# Patient Record
Sex: Male | Born: 1980 | Race: White | Hispanic: No | Marital: Married | Smoking: Former smoker
Health system: Southern US, Community
[De-identification: ages and names within clinical notes are randomized; demographics above are authoritative.]

## PROBLEM LIST (undated history)

## (undated) DIAGNOSIS — T7840XA Allergy, unspecified, initial encounter: Secondary | ICD-10-CM

## (undated) HISTORY — DX: Allergy, unspecified, initial encounter: T78.40XA

## (undated) HISTORY — PX: WRIST FRACTURE SURGERY: SHX121

## (undated) HISTORY — PX: NASAL SEPTUM SURGERY: SHX37

---

## 2003-12-01 ENCOUNTER — Emergency Department (HOSPITAL_COMMUNITY): Admission: EM | Admit: 2003-12-01 | Discharge: 2003-12-01 | Payer: Self-pay | Admitting: Emergency Medicine

## 2007-05-31 ENCOUNTER — Ambulatory Visit: Payer: Self-pay | Admitting: Internal Medicine

## 2007-05-31 DIAGNOSIS — J309 Allergic rhinitis, unspecified: Secondary | ICD-10-CM | POA: Insufficient documentation

## 2007-06-01 LAB — CONVERTED CEMR LAB
ALT: 30 units/L (ref 0–53)
Bilirubin, Direct: 0.3 mg/dL (ref 0.0–0.3)
CO2: 29 meq/L (ref 19–32)
Calcium: 9.4 mg/dL (ref 8.4–10.5)
Eosinophils Absolute: 0.2 10*3/uL (ref 0.0–0.6)
Eosinophils Relative: 2.6 % (ref 0.0–5.0)
GFR calc Af Amer: 131 mL/min
GFR calc non Af Amer: 108 mL/min
Glucose, Bld: 89 mg/dL (ref 70–99)
HDL: 29.3 mg/dL — ABNORMAL LOW (ref >39.0)
Hemoglobin: 15.3 g/dL (ref 13.0–17.0)
Lymphocytes Relative: 26.9 % (ref 12.0–46.0)
MCV: 89.5 fL (ref 78.0–100.0)
Monocytes Absolute: 0.7 10*3/uL (ref 0.2–0.7)
Neutro Abs: 4.6 10*3/uL (ref 1.4–7.7)
Platelets: 254 10*3/uL (ref 150–400)
Potassium: 4.2 meq/L (ref 3.5–5.1)
Sodium: 139 meq/L (ref 135–145)
TSH: 0.84 microintl units/mL (ref 0.35–5.50)
Total Protein: 6.8 g/dL (ref 6.0–8.3)
Triglycerides: 110 mg/dL (ref 0–149)
WBC: 7.5 10*3/uL (ref 4.5–10.5)

## 2008-01-11 ENCOUNTER — Ambulatory Visit: Payer: Self-pay | Admitting: Gastroenterology

## 2008-01-16 LAB — CONVERTED CEMR LAB
ALT: 22 units/L (ref 0–53)
Basophils Absolute: 0 10*3/uL (ref 0.0–0.1)
Basophils Relative: 0.3 % (ref 0.0–3.0)
CO2: 31 meq/L (ref 19–32)
Calcium: 8.9 mg/dL (ref 8.4–10.5)
Chloride: 105 meq/L (ref 96–112)
Creatinine, Ser: 0.8 mg/dL (ref 0.4–1.5)
GFR calc Af Amer: 149 mL/min
GFR calc non Af Amer: 123 mL/min
Glucose, Bld: 89 mg/dL (ref 70–99)
Hemoglobin: 15.3 g/dL (ref 13.0–17.0)
Lymphocytes Relative: 31.7 % (ref 12.0–46.0)
Monocytes Relative: 8.8 % (ref 3.0–12.0)
Neutro Abs: 3.6 10*3/uL (ref 1.4–7.7)
Neutrophils Relative %: 56.1 % (ref 43.0–77.0)
RBC: 4.86 M/uL (ref 4.22–5.81)
Total Bilirubin: 1.4 mg/dL — ABNORMAL HIGH (ref 0.3–1.2)
Total Protein: 6.7 g/dL (ref 6.0–8.3)
WBC: 6.5 10*3/uL (ref 4.5–10.5)

## 2008-01-29 ENCOUNTER — Encounter: Payer: Self-pay | Admitting: Internal Medicine

## 2008-01-29 ENCOUNTER — Ambulatory Visit: Payer: Self-pay | Admitting: Gastroenterology

## 2008-01-29 ENCOUNTER — Encounter: Payer: Self-pay | Admitting: Gastroenterology

## 2008-02-03 ENCOUNTER — Encounter: Payer: Self-pay | Admitting: Gastroenterology

## 2008-06-13 ENCOUNTER — Ambulatory Visit: Payer: Self-pay | Admitting: Internal Medicine

## 2008-06-13 LAB — CONVERTED CEMR LAB
ALT: 19 units/L (ref 0–53)
AST: 20 units/L (ref 0–37)
Albumin: 4.2 g/dL (ref 3.5–5.2)
Basophils Relative: 0 % (ref 0.0–3.0)
Bilirubin Urine: NEGATIVE
Blood in Urine, dipstick: NEGATIVE
Chloride: 106 meq/L (ref 96–112)
Eosinophils Relative: 4.9 % (ref 0.0–5.0)
GFR calc non Af Amer: 122.8 mL/min (ref >60)
Glucose, Urine, Semiquant: NEGATIVE
HCT: 45 % (ref 39.0–52.0)
Hemoglobin: 15.7 g/dL (ref 13.0–17.0)
Ketones, urine, test strip: NEGATIVE
LDL Cholesterol: 117 mg/dL — ABNORMAL HIGH (ref 0–99)
Lymphs Abs: 1.7 10*3/uL (ref 0.7–4.0)
MCV: 90.3 fL (ref 78.0–100.0)
Monocytes Relative: 8.2 % (ref 3.0–12.0)
Neutro Abs: 3.6 10*3/uL (ref 1.4–7.7)
Nitrite: NEGATIVE
Potassium: 4.4 meq/L (ref 3.5–5.1)
Protein, U semiquant: NEGATIVE
RBC: 4.98 M/uL (ref 4.22–5.81)
Sodium: 144 meq/L (ref 135–145)
Specific Gravity, Urine: 1.02
TSH: 1.04 microintl units/mL (ref 0.35–5.50)
Total Bilirubin: 1.7 mg/dL — ABNORMAL HIGH (ref 0.3–1.2)
Total Protein: 7.4 g/dL (ref 6.0–8.3)
Urobilinogen, UA: 0.2
VLDL: 6.4 mg/dL (ref 0.0–40.0)
WBC Urine, dipstick: NEGATIVE
WBC: 6.1 10*3/uL (ref 4.5–10.5)
pH: 5

## 2008-07-11 ENCOUNTER — Ambulatory Visit: Payer: Self-pay | Admitting: Internal Medicine

## 2008-11-21 ENCOUNTER — Ambulatory Visit: Payer: Self-pay | Admitting: Family Medicine

## 2008-12-18 ENCOUNTER — Ambulatory Visit (HOSPITAL_COMMUNITY): Payer: Self-pay | Admitting: Psychology

## 2009-01-01 ENCOUNTER — Ambulatory Visit (HOSPITAL_COMMUNITY): Payer: Self-pay | Admitting: Psychology

## 2009-01-08 ENCOUNTER — Ambulatory Visit (HOSPITAL_COMMUNITY): Payer: Self-pay | Admitting: Psychology

## 2009-01-15 ENCOUNTER — Ambulatory Visit (HOSPITAL_COMMUNITY): Payer: Self-pay | Admitting: Psychology

## 2009-01-29 ENCOUNTER — Ambulatory Visit (HOSPITAL_COMMUNITY): Payer: Self-pay | Admitting: Psychology

## 2009-02-06 ENCOUNTER — Ambulatory Visit: Payer: Self-pay | Admitting: Internal Medicine

## 2009-02-06 DIAGNOSIS — F17200 Nicotine dependence, unspecified, uncomplicated: Secondary | ICD-10-CM

## 2009-02-11 ENCOUNTER — Ambulatory Visit (HOSPITAL_COMMUNITY): Payer: Self-pay | Admitting: Psychiatry

## 2009-03-13 ENCOUNTER — Ambulatory Visit: Payer: Self-pay | Admitting: Internal Medicine

## 2009-03-18 ENCOUNTER — Ambulatory Visit (HOSPITAL_COMMUNITY): Payer: Self-pay | Admitting: Psychiatry

## 2009-04-20 ENCOUNTER — Ambulatory Visit (HOSPITAL_COMMUNITY): Payer: Self-pay | Admitting: Psychiatry

## 2009-05-21 ENCOUNTER — Ambulatory Visit: Payer: Self-pay | Admitting: Internal Medicine

## 2009-06-23 ENCOUNTER — Ambulatory Visit: Payer: Self-pay | Admitting: Internal Medicine

## 2009-07-01 ENCOUNTER — Ambulatory Visit (HOSPITAL_COMMUNITY): Payer: Self-pay | Admitting: Psychiatry

## 2009-08-12 ENCOUNTER — Ambulatory Visit: Payer: Self-pay | Admitting: Family Medicine

## 2009-08-21 ENCOUNTER — Ambulatory Visit: Payer: Self-pay | Admitting: Internal Medicine

## 2009-08-21 DIAGNOSIS — N453 Epididymo-orchitis: Secondary | ICD-10-CM | POA: Insufficient documentation

## 2009-09-10 ENCOUNTER — Ambulatory Visit: Payer: Self-pay | Admitting: Internal Medicine

## 2009-10-14 ENCOUNTER — Ambulatory Visit (HOSPITAL_COMMUNITY): Payer: Self-pay | Admitting: Psychiatry

## 2009-10-21 ENCOUNTER — Ambulatory Visit (HOSPITAL_COMMUNITY): Payer: Self-pay | Admitting: Marriage and Family Therapist

## 2009-10-27 ENCOUNTER — Telehealth: Payer: Self-pay | Admitting: Internal Medicine

## 2009-11-11 ENCOUNTER — Ambulatory Visit (HOSPITAL_COMMUNITY): Payer: Self-pay | Admitting: Psychiatry

## 2009-11-25 ENCOUNTER — Ambulatory Visit (HOSPITAL_COMMUNITY): Payer: Self-pay | Admitting: Marriage and Family Therapist

## 2009-12-02 ENCOUNTER — Ambulatory Visit (HOSPITAL_COMMUNITY): Payer: Self-pay | Admitting: Marriage and Family Therapist

## 2009-12-07 ENCOUNTER — Ambulatory Visit (HOSPITAL_COMMUNITY): Payer: Self-pay | Admitting: Psychiatry

## 2009-12-08 ENCOUNTER — Ambulatory Visit: Payer: Self-pay | Admitting: Internal Medicine

## 2009-12-23 ENCOUNTER — Ambulatory Visit (HOSPITAL_COMMUNITY): Payer: Self-pay | Admitting: Marriage and Family Therapist

## 2009-12-30 ENCOUNTER — Ambulatory Visit (HOSPITAL_COMMUNITY): Payer: Self-pay | Admitting: Marriage and Family Therapist

## 2010-01-04 ENCOUNTER — Ambulatory Visit (HOSPITAL_COMMUNITY): Payer: Self-pay | Admitting: Psychiatry

## 2010-02-15 ENCOUNTER — Ambulatory Visit (HOSPITAL_COMMUNITY): Payer: Self-pay | Admitting: Psychiatry

## 2010-03-15 ENCOUNTER — Ambulatory Visit (HOSPITAL_COMMUNITY): Payer: Self-pay | Admitting: Psychiatry

## 2010-03-15 ENCOUNTER — Inpatient Hospital Stay (HOSPITAL_COMMUNITY)
Admission: RE | Admit: 2010-03-15 | Discharge: 2010-03-18 | Payer: Self-pay | Source: Home / Self Care | Attending: Psychiatry | Admitting: Psychiatry

## 2010-03-23 ENCOUNTER — Other Ambulatory Visit (HOSPITAL_COMMUNITY)
Admission: RE | Admit: 2010-03-23 | Discharge: 2010-04-15 | Payer: Self-pay | Source: Home / Self Care | Attending: Psychiatry | Admitting: Psychiatry

## 2010-04-19 ENCOUNTER — Ambulatory Visit (HOSPITAL_COMMUNITY): Admit: 2010-04-19 | Payer: Self-pay | Admitting: Psychiatry

## 2010-04-21 ENCOUNTER — Ambulatory Visit (HOSPITAL_COMMUNITY)
Admission: RE | Admit: 2010-04-21 | Discharge: 2010-04-21 | Payer: Self-pay | Source: Home / Self Care | Attending: Marriage and Family Therapist | Admitting: Marriage and Family Therapist

## 2010-04-28 NOTE — Assessment & Plan Note (Signed)
Summary: ?bronchitis/njr   Vital Signs:  Patient profile:   30 year old male Weight:      224 pounds Temp:     98.7 degrees F oral BP sitting:   120 / 78  (right arm) Cuff size:   regular  Vitals Entered By: Duard Brady LPN (May 21, 2009 3:41 PM) CC: c/o congestion , productive cough - son and wife with bronchitis Is Patient Diabetic? No   Primary Care Provider:  Odette Horns MD  CC:  c/o congestion  and productive cough - son and wife with bronchitis.  History of Present Illness: 30 year old patient who presents with a 3-day history of head and chest congestion.  He has had minimal cough.  He does have a history of asthma, which has been stable this week.  No fever, wheezing, shortness of breath or chest pain.  Denies any chills.  His wife is been treated for bronchitis.  Preventive Screening-Counseling & Management  Alcohol-Tobacco     Smoking Status: current  Allergies (verified): No Known Drug Allergies  Past History:  Past Medical History: Reviewed history from 02/06/2009 and no changes required. Allergic rhinitis  never tested  Childhood asthma  Past Surgical History: Reviewed history from 02/06/2009 and no changes required. Fractured wrist, 2000 with pinning  Flex Sigmoidoscopy 11-09 hx of  turbinate reduction 2003  deviated septum   Review of Systems       The patient complains of prolonged cough.  The patient denies anorexia, fever, weight loss, weight gain, vision loss, decreased hearing, hoarseness, chest pain, syncope, dyspnea on exertion, peripheral edema, headaches, hemoptysis, abdominal pain, melena, hematochezia, severe indigestion/heartburn, hematuria, incontinence, genital sores, muscle weakness, suspicious skin lesions, transient blindness, difficulty walking, depression, unusual weight change, abnormal bleeding, enlarged lymph nodes, angioedema, breast masses, and testicular masses.    Physical Exam  General:   Well-developed,well-nourished,in no acute distress; alert,appropriate and cooperative throughout examination Head:  Normocephalic and atraumatic without obvious abnormalities. No apparent alopecia or balding. Eyes:  No corneal or conjunctival inflammation noted. EOMI. Perrla. Funduscopic exam benign, without hemorrhages, exudates or papilledema. Vision grossly normal. Ears:  External ear exam shows no significant lesions or deformities.  Otoscopic examination reveals clear canals, tympanic membranes are intact bilaterally without bulging, retraction, inflammation or discharge. Hearing is grossly normal bilaterally. Mouth:  Oral mucosa and oropharynx without lesions or exudates.  Teeth in good repair. Neck:  No deformities, masses, or tenderness noted. Lungs:  Normal respiratory effort, chest expands symmetrically. Lungs are clear to auscultation, no crackles or wheezes. Heart:  Normal rate and regular rhythm. S1 and S2 normal without gallop, murmur, click, rub or other extra sounds. Abdomen:  Bowel sounds positive,abdomen soft and non-tender without masses, organomegaly or hernias noted.   Impression & Recommendations:  Problem # 1:  URI (ICD-465.9)  His updated medication list for this problem includes:    Hydrocodone-homatropine 5-1.5 Mg/42ml Syrp (Hydrocodone-homatropine) ..... One tsp every 6 hours for cough  His updated medication list for this problem includes:    Hydrocodone-homatropine 5-1.5 Mg/33ml Syrp (Hydrocodone-homatropine) ..... One tsp every 6 hours for cough  Problem # 2:  ? of COUGH VARIANT ASTHMA (ICD-493.82)  The following medications were removed from the medication list:    Qvar 40 Mcg/act Aers (Beclomethasone dipropionate) .Marland Kitchen... 2 puffs two times a day His updated medication list for this problem includes:    Ventolin Hfa 108 (90 Base) Mcg/act Aers (Albuterol sulfate) .Marland Kitchen... 2 puffs q 6 hours as needed wheezing    Qvar  80 Mcg/act Aers (Beclomethasone dipropionate) .....  Use twice daily  The following medications were removed from the medication list:    Qvar 40 Mcg/act Aers (Beclomethasone dipropionate) .Marland Kitchen... 2 puffs two times a day His updated medication list for this problem includes:    Ventolin Hfa 108 (90 Base) Mcg/act Aers (Albuterol sulfate) .Marland Kitchen... 2 puffs q 6 hours as needed wheezing    Qvar 80 Mcg/act Aers (Beclomethasone dipropionate) ..... Use twice daily  Complete Medication List: 1)  Multi Vitamin Mens Tabs (Multiple vitamin) .... Take 1 tab daily 2)  Ventolin Hfa 108 (90 Base) Mcg/act Aers (Albuterol sulfate) .... 2 puffs q 6 hours as needed wheezing 3)  Lamictal 100 Mg Tabs (Lamotrigine) .Marland Kitchen.. 1 once daily 4)  Qvar 80 Mcg/act Aers (Beclomethasone dipropionate) .... Use twice daily 5)  Hydrocodone-homatropine 5-1.5 Mg/79ml Syrp (Hydrocodone-homatropine) .... One tsp every 6 hours for cough  Patient Instructions: 1)  Get plenty of rest, drink lots of clear liquids, and use Tylenol or Ibuprofen for fever and comfort. Return in 7-10 days if you're not better:sooner if you're feeling worse. Prescriptions: HYDROCODONE-HOMATROPINE 5-1.5 MG/5ML SYRP (HYDROCODONE-HOMATROPINE) one tsp every 6 hours for cough  #6 oz x 0   Entered and Authorized by:   Gordy Savers  MD   Signed by:   Gordy Savers  MD on 05/21/2009   Method used:   Print then Give to Patient   RxID:   [id number] QVAR 80 MCG/ACT AERS (BECLOMETHASONE DIPROPIONATE) use twice daily  #3 x 6   Entered and Authorized by:   Gordy Savers  MD   Signed by:   Gordy Savers  MD on 05/21/2009   Method used:   Print then Give to Patient   RxID:   [id number]

## 2010-04-28 NOTE — Assessment & Plan Note (Signed)
Summary: ?STREP/ACHEY/FEVER/CJR   Vital Signs:  Patient profile:   30 year old male Temp:     98.4 degrees F oral BP sitting:   110 / 72  (left arm) Cuff size:   large  Vitals Entered By: Sid Falcon LPN (Aug 12, 2009 2:48 PM) CC: ? strep throat, low grade fever   History of Present Illness: Acute visit for sore throat. Onset 4 days ago. Diffuse body aches. Low-grade fever and intermittent headache. Denies any nausea, vomiting, or diarrhea. No nasal congestive symptoms and no cough.  Allergies (verified): No Known Drug Allergies  Past History:  Past Medical History: Last updated: 02/06/2009 Allergic rhinitis  never tested  Childhood asthma  Review of Systems      See HPI  Physical Exam  General:  Well-developed,well-nourished,in no acute distress; alert,appropriate and cooperative throughout examination Ears:  External ear exam shows no significant lesions or deformities.  Otoscopic examination reveals clear canals, tympanic membranes are intact bilaterally without bulging, retraction, inflammation or discharge. Hearing is grossly normal bilaterally. Nose:  External nasal examination shows no deformity or inflammation. Nasal mucosa are pink and moist without lesions or exudates. Mouth:  2+ symmetrically enlarged tonsils which are erythematous without exudate Neck:  No deformities, masses, or tenderness noted. Lungs:  Normal respiratory effort, chest expands symmetrically. Lungs are clear to auscultation, no crackles or wheezes. Heart:  Normal rate and regular rhythm. S1 and S2 normal without gallop, murmur, click, rub or other extra sounds. Skin:  no rash   Impression & Recommendations:  Problem # 1:  ACUTE PHARYNGITIS (ICD-462)  rapid strep neg but clinical suspicion for possible strep with lack of nasal sxs, cough,et.  His updated medication list for this problem includes:    Amoxicillin 875 Mg Tabs (Amoxicillin) ..... One by mouth two times a day for 10  days  Orders: Rapid Strep ([address])  Complete Medication List: 1)  Multi Vitamin Mens Tabs (Multiple vitamin) .... Take 1 tab daily 2)  Ventolin Hfa 108 (90 Base) Mcg/act Aers (Albuterol sulfate) .... 2 puffs q 6 hours as needed wheezing 3)  Lamictal 100 Mg Tabs (Lamotrigine) .Marland Kitchen.. 1 once daily 4)  Qvar 80 Mcg/act Aers (Beclomethasone dipropionate) .... Use twice daily 5)  Hydrocodone-homatropine 5-1.5 Mg/2ml Syrp (Hydrocodone-homatropine) .... One tsp every 6 hours for cough 6)  Prednisone 10 Mg Tabs (Prednisone) .... One twice daily 7)  Amoxicillin 875 Mg Tabs (Amoxicillin) .... One by mouth two times a day for 10 days  Patient Instructions: 1)  Be in touch if symptoms not improving over the next several days Prescriptions: AMOXICILLIN 875 MG TABS (AMOXICILLIN) one by mouth two times a day for 10 days  #20 x 0   Entered and Authorized by:   Evelena Peat MD   Signed by:   Evelena Peat MD on 08/12/2009   Method used:   [facility]        [facility] [phone][facility] (retail)       [address]       [place], [place]  [address]       Ph: [phone]       Fax: [phone]   RxID:   [id number]   Laboratory Results    Other Tests  Rapid Strep: negative Comments: Rita Ohara  Aug 12, 2009 2:53 PM   Kit Test Internal QC: Negative   (Normal Range: Negative)

## 2010-04-28 NOTE — Assessment & Plan Note (Signed)
Summary: gi issues--ok per dr k//ccm   Vital Signs:  Patient profile:   30 year old male Weight:      222 pounds Temp:     98.4 degrees F oral BP sitting:   100 / 60  (right arm) Cuff size:   regular  Vitals Entered By: Duard Brady LPN (September 10, 2009 9:30 AM) CC: f/u on last visit  Is Patient Diabetic? No   Primary Care Provider:  Odette Horns MD  CC:  f/u on last visit .  History of Present Illness: 30 year old patient who was treated 3 weeks ago for epididymitis.  since that time.  All symptoms have resolved including testicular pain, fever, and chills.  He feels his left testicle is somewhat smaller than the right and this is what prompted his visit.  He was concerned about permanent testicular damage or impaired function related to the infection.  Preventive Screening-Counseling & Management  Alcohol-Tobacco     Smoking Status: current  Allergies (verified): No Known Drug Allergies And he is to and a Physical Exam  General:  Well-developed,well-nourished,in no acute distress; alert,appropriate and cooperative throughout examination Genitalia:  the left testicle was slightly smaller compared to the right, but no other abnormalities were appreciated   Impression & Recommendations:  Problem # 1:  EPIDIDYMITIS (ICD-604.90) patient's epididymitis has resolved, and his clinical exam is felt to be within normal limits.  Will clinically observe at this time.  Will consider a scrotal ultrasound or urological referral.  If there is any change  Complete Medication List: 1)  Multi Vitamin Mens Tabs (Multiple vitamin) .... Take 1 tab daily 2)  Ventolin Hfa 108 (90 Base) Mcg/act Aers (Albuterol sulfate) .... 2 puffs q 6 hours as needed wheezing 3)  Lamictal 100 Mg Tabs (Lamotrigine) .Marland Kitchen.. 1 once daily 4)  Qvar 80 Mcg/act Aers (Beclomethasone dipropionate) .... Use twice daily 5)  Prednisone 10 Mg Tabs (Prednisone) .... One twice daily 6)  Amoxicillin 875 Mg Tabs  (Amoxicillin) .... One by mouth two times a day for 10 days 7)  Levaquin 500 Mg Tabs (Levofloxacin) .... One tablet daily  Patient Instructions: 1)  Please schedule a follow-up appointment as needed.

## 2010-04-28 NOTE — Progress Notes (Signed)
Summary: nasonex rx  Phone Note Call from Patient   Caller: Patient Call For: Gordy Savers  MD Summary of Call: has been seen in the past for sinus issues, was given sample of nasonex - worked well - would like rx for med called to [facility] if possible.   can be reach at [phone] Initial call taken by: Duard Brady LPN,  October 27, 2009 1:53 PM  Follow-up for Phone Call        ok  RF 3 Follow-up by: Gordy Savers  MD,  October 27, 2009 1:56 PM  Additional Follow-up for Phone Call Additional follow up Details #1::        attempt to call - ans mach - LMTCB if questions - [facility] . change to med list  done.   KIK Additional Follow-up by: Duard Brady LPN,  October 27, 2009 2:18 PM    New/Updated Medications: NASONEX 50 MCG/ACT SUSP (MOMETASONE FUROATE) 2 sprays each nostril once daily Prescriptions: NASONEX 50 MCG/ACT SUSP (MOMETASONE FUROATE) 2 sprays each nostril once daily  #1 x 3   Entered by:   Duard Brady LPN   Authorized by:   Gordy Savers  MD   Signed by:   Duard Brady LPN on 09/81/1914   Method used:   [facility] to        [facility] [phone]* (retail)       [address]       [place], [place]  [address]       Ph: [phone]       Fax: [phone]   RxID:   [id number]

## 2010-04-28 NOTE — Assessment & Plan Note (Signed)
Summary: lower abdominal pain/cjr   Vital Signs:  Patient profile:   30 year old male Weight:      222 pounds Temp:     100.2 degrees F oral BP sitting:   110 / 74  (right arm) Cuff size:   regular  Vitals Entered By: Duard Brady LPN (Aug 21, 2009 11:14 AM) CC: c/o fever 102-103 , swelling in both testicals since last night only Is Patient Diabetic? No   Primary Care Provider:  Odette Horns MD  CC:  c/o fever 102-103  and swelling in both testicals since last night only.  History of Present Illness: 30 year old patient who is complaining amoxicillin for acute sinusitis.  Yesterday, at approximately  5 p.m. he developed bilateral testicular discomfort; through the night.  He has had fever and some associated chills.  He presents today with a chief complaint of testicular pain.  The left greater than right.  He is married with a young child at home and has had a monogamous relationship.  No history of STDs  Allergies (verified): No Known Drug Allergies  Past History:  Past Medical History: Reviewed history from 02/06/2009 and no changes required. Allergic rhinitis  never tested  Childhood asthma  Physical Exam  General:  Well-developed,well-nourished,in no acute distress; alert,appropriate and cooperative throughout examination Head:  Normocephalic and atraumatic without obvious abnormalities. No apparent alopecia or balding. Eyes:  No corneal or conjunctival inflammation noted. EOMI. Perrla. Funduscopic exam benign, without hemorrhages, exudates or papilledema. Vision grossly normal. Ears:  External ear exam shows no significant lesions or deformities.  Otoscopic examination reveals clear canals, tympanic membranes are intact bilaterally without bulging, retraction, inflammation or discharge. Hearing is grossly normal bilaterally. Mouth:  pharyngeal erythema.  pharyngeal erythema.   Neck:  No deformities, masses, or tenderness noted. Lungs:  Normal respiratory  effort, chest expands symmetrically. Lungs are clear to auscultation, no crackles or wheezes. Heart:  Normal rate and regular rhythm. S1 and S2 normal without gallop, murmur, click, rub or other extra sounds. Abdomen:  Bowel sounds positive,abdomen soft and non-tender without masses, organomegaly or hernias noted. Genitalia:  bilateral testicular tenderness, but no exquisite pain or swelling noted no inguinal  adenopathy   Impression & Recommendations:  Problem # 1:  EPIDIDYMITIS (ICD-604.90)  Problem # 2:  ACUTE PHARYNGITIS (ICD-462)  His updated medication list for this problem includes:    Amoxicillin 875 Mg Tabs (Amoxicillin) ..... One by mouth two times a day for 10 days    Levaquin 500 Mg Tabs (Levofloxacin) ..... One tablet daily  His updated medication list for this problem includes:    Amoxicillin 875 Mg Tabs (Amoxicillin) ..... One by mouth two times a day for 10 days    Levaquin 500 Mg Tabs (Levofloxacin) ..... One tablet daily  Complete Medication List: 1)  Multi Vitamin Mens Tabs (Multiple vitamin) .... Take 1 tab daily 2)  Ventolin Hfa 108 (90 Base) Mcg/act Aers (Albuterol sulfate) .... 2 puffs q 6 hours as needed wheezing 3)  Lamictal 100 Mg Tabs (Lamotrigine) .Marland Kitchen.. 1 once daily 4)  Qvar 80 Mcg/act Aers (Beclomethasone dipropionate) .... Use twice daily 5)  Prednisone 10 Mg Tabs (Prednisone) .... One twice daily 6)  Amoxicillin 875 Mg Tabs (Amoxicillin) .... One by mouth two times a day for 10 days 7)  Levaquin 500 Mg Tabs (Levofloxacin) .... One tablet daily  Patient Instructions: 1)  Take your antibiotic as prescribed until ALL of it is gone, but stop if you develop a  rash or swelling and contact our office as soon as possible. 2)  call if pain or fever worsens Prescriptions: LEVAQUIN 500 MG TABS (LEVOFLOXACIN) one tablet daily  #7 x 0   Entered and Authorized by:   Gordy Savers  MD   Signed by:   Gordy Savers  MD on 08/21/2009   Method used:   Print  then Give to Patient   RxID:   [id number]

## 2010-04-28 NOTE — Assessment & Plan Note (Signed)
Summary: tonsil issues//cm   Vital Signs:  Patient profile:   30 year old male Weight:      224 pounds Temp:     99.4 degrees F oral BP sitting:   126 / 80  (left arm) Cuff size:   regular  Vitals Entered By: Duard Brady LPN (December 08, 2009 10:16 AM) CC: c/o sore throat  and congestions x1wk Is Patient Diabetic? No   Primary Care Provider:  Odette Horns MD  CC:  c/o sore throat  and congestions x1wk.  History of Present Illness: 30 year old patient who has a history of chronic recurrent tonsillitis.  He has had episodes for recurrent strep pharyngitis as well.  For the past week.  He has had increasing sore throat that has worsened.  over the years.  He has had recurrent tonsillitis that has resulted in considerable loss of work.  He is noted.  Low grade fever.  He has a history of asthma, which has been stable.  He is asking about ENT referral and possible tonsillectomy  Preventive Screening-Counseling & Management  Alcohol-Tobacco     Smoking Cessation Counseling: yes  Allergies (verified): No Known Drug Allergies  Past History:  Past Medical History: Allergic rhinitis  never tested  Childhood asthma recurrent pustular tonsillitis  Past Surgical History: Reviewed history from 02/06/2009 and no changes required. Fractured wrist, 2000 with pinning  Flex Sigmoidoscopy 11-09 hx of  turbinate reduction 2003  deviated septum   Review of Systems       The patient complains of fever.  The patient denies anorexia, weight loss, weight gain, vision loss, decreased hearing, hoarseness, chest pain, syncope, dyspnea on exertion, peripheral edema, prolonged cough, headaches, hemoptysis, abdominal pain, melena, hematochezia, severe indigestion/heartburn, hematuria, incontinence, genital sores, muscle weakness, suspicious skin lesions, transient blindness, difficulty walking, depression, unusual weight change, abnormal bleeding, enlarged lymph nodes, angioedema, breast  masses, and testicular masses.    Physical Exam  General:  Well-developed,well-nourished,in no acute distress; alert,appropriate and cooperative throughout examination Head:  Normocephalic and atraumatic without obvious abnormalities. No apparent alopecia or balding. Eyes:  No corneal or conjunctival inflammation noted. EOMI. Perrla. Funduscopic exam benign, without hemorrhages, exudates or papilledema. Vision grossly normal. Ears:  External ear exam shows no significant lesions or deformities.  Otoscopic examination reveals clear canals, tympanic membranes are intact bilaterally without bulging, retraction, inflammation or discharge. Hearing is grossly normal bilaterally. Nose:  External nasal examination shows no deformity or inflammation. Nasal mucosa are pink and moist without lesions or exudates. Mouth:  markedly enlarged tonsils with pustules Neck:  No deformities, masses, or tenderness noted. Lungs:  Normal respiratory effort, chest expands symmetrically. Lungs are clear to auscultation, no crackles or wheezes. Heart:  Normal rate and regular rhythm. S1 and S2 normal without gallop, murmur, click, rub or other extra sounds.   Impression & Recommendations:  Problem # 1:  ACUTE PHARYNGITIS (ICD-462)  The following medications were removed from the medication list:    Amoxicillin 875 Mg Tabs (Amoxicillin) ..... One by mouth two times a day for 10 days    Levaquin 500 Mg Tabs (Levofloxacin) ..... One tablet daily His updated medication list for this problem includes:    Amoxicillin-pot Clavulanate 875-125 Mg Tabs (Amoxicillin-pot clavulanate) ..... One twice daily  Orders: Rapid Strep ([address]109) ENT Referral (ENT)  The following medications were removed from the medication list:    Amoxicillin 875 Mg Tabs (Amoxicillin) ..... One by mouth two times a day for 10 days    Levaquin 500  Mg Tabs (Levofloxacin) ..... One tablet daily His updated medication list for this problem includes:     Amoxicillin-pot Clavulanate 875-125 Mg Tabs (Amoxicillin-pot clavulanate) ..... One twice daily  Problem # 2:  ? of COUGH VARIANT ASTHMA (ICD-493.82)  The following medications were removed from the medication list:    Prednisone 10 Mg Tabs (Prednisone) ..... One twice daily His updated medication list for this problem includes:    Ventolin Hfa 108 (90 Base) Mcg/act Aers (Albuterol sulfate) .Marland Kitchen... 2 puffs q 6 hours as needed wheezing    Qvar 80 Mcg/act Aers (Beclomethasone dipropionate) ..... Use twice daily  The following medications were removed from the medication list:    Prednisone 10 Mg Tabs (Prednisone) ..... One twice daily His updated medication list for this problem includes:    Ventolin Hfa 108 (90 Base) Mcg/act Aers (Albuterol sulfate) .Marland Kitchen... 2 puffs q 6 hours as needed wheezing    Qvar 80 Mcg/act Aers (Beclomethasone dipropionate) ..... Use twice daily  Complete Medication List: 1)  Multi Vitamin Mens Tabs (Multiple vitamin) .... Take 1 tab daily 2)  Ventolin Hfa 108 (90 Base) Mcg/act Aers (Albuterol sulfate) .... 2 puffs q 6 hours as needed wheezing 3)  Lamictal 100 Mg Tabs (Lamotrigine) .Marland Kitchen.. 1 once daily 4)  Qvar 80 Mcg/act Aers (Beclomethasone dipropionate) .... Use twice daily 5)  Nasonex 50 Mcg/act Susp (Mometasone furoate) .... 2 sprays each nostril once daily 6)  Abilify 10 Mg Tabs (Aripiprazole) .... Qd 7)  Amoxicillin-pot Clavulanate 875-125 Mg Tabs (Amoxicillin-pot clavulanate) .... One twice daily  Patient Instructions: 1)  ENT consult, as scheduled 2)  Tobacco is very bad for your health and your loved ones! You Should stop smoking!. 3)  Please schedule a follow-up appointment as needed. 4)  Take your antibiotic as prescribed until ALL of it is gone, but stop if you develop a rash or swelling and contact our office as soon as possible. Prescriptions: AMOXICILLIN-POT CLAVULANATE 875-125 MG TABS (AMOXICILLIN-POT CLAVULANATE) one twice daily  #20 x 0   Entered and  Authorized by:   Gordy Savers  MD   Signed by:   Gordy Savers  MD on 12/08/2009   Method used:   [facility] to        [facility] [phone]* (retail)       [address]       [place], [place]  [address]       Ph: [phone]       Fax: [phone]   RxID:   [id number]   Laboratory Results  Date/Time Received: December 08, 2009 10:33 AM  Date/Time Reported: December 08, 2009 10:33 AM   Other Tests  Rapid Strep: negative

## 2010-04-28 NOTE — Assessment & Plan Note (Signed)
Summary: BODY ACHES, FEVER, LETHARGIC // RS   Vital Signs:  Patient profile:   30 year old male Weight:      224 pounds Temp:     97.6 degrees F oral BP sitting:   120 / 80  (right arm) Cuff size:   regular  Vitals Entered By: Duard Brady LPN (June 23, 2009 3:17 PM) CC: c/o bodyaches and fatigue , cold sweats  since yesterday Is Patient Diabetic? No   Primary Care Provider:  Odette Horns MD  CC:  c/o bodyaches and fatigue  and cold sweats  since yesterday.  History of Present Illness: 30 year old  patient, who has a history of asthma.  He was last seen here approximately 6 weeks ago with you are on and has had some low-grade persistent cough.  He is on maintenance inhalational steroids, as well as albuterol.  The past couple days.  She has had myalgias, weakness sweats, and increasing cough, and chest congestion.  He has felt unwell has been no documented fever but he has been taking Tylenol.  No active wheezing or shortness of breath.  He does have a history of ongoing tobacco use  Preventive Screening-Counseling & Management  Alcohol-Tobacco     Smoking Status: current     Smoking Cessation Counseling: yes  Allergies (verified): No Known Drug Allergies  Past History:  Past Medical History: Reviewed history from 02/06/2009 and no changes required. Allergic rhinitis  never tested  Childhood asthma  Review of Systems       The patient complains of anorexia, fever, and prolonged cough.  The patient denies weight loss, weight gain, vision loss, decreased hearing, hoarseness, chest pain, syncope, dyspnea on exertion, peripheral edema, headaches, hemoptysis, abdominal pain, melena, hematochezia, severe indigestion/heartburn, hematuria, incontinence, genital sores, muscle weakness, suspicious skin lesions, transient blindness, difficulty walking, depression, unusual weight change, abnormal bleeding, enlarged lymph nodes, angioedema, breast masses, and testicular  masses.    Physical Exam  General:  Well-developed,well-nourished,in no acute distress; alert,appropriate and cooperative throughout examination Head:  Normocephalic and atraumatic without obvious abnormalities. No apparent alopecia or balding. Eyes:  No corneal or conjunctival inflammation noted. EOMI. Perrla. Funduscopic exam benign, without hemorrhages, exudates or papilledema. Vision grossly normal. Ears:  External ear exam shows no significant lesions or deformities.  Otoscopic examination reveals clear canals, tympanic membranes are intact bilaterally without bulging, retraction, inflammation or discharge. Hearing is grossly normal bilaterally. Mouth:  Oral mucosa and oropharynx without lesions or exudates.  Teeth in good repair. Neck:  No deformities, masses, or tenderness noted. Lungs:  few coarse rhonchi faint wheezing on the right normal expiratory effort Heart:  Normal rate and regular rhythm. S1 and S2 normal without gallop, murmur, click, rub or other extra sounds. Abdomen:  Bowel sounds positive,abdomen soft and non-tender without masses, organomegaly or hernias noted.   Impression & Recommendations:  Problem # 1:  URI (ICD-465.9)  His updated medication list for this problem includes:    Hydrocodone-homatropine 5-1.5 Mg/19ml Syrp (Hydrocodone-homatropine) ..... One tsp every 6 hours for cough  His updated medication list for this problem includes:    Hydrocodone-homatropine 5-1.5 Mg/40ml Syrp (Hydrocodone-homatropine) ..... One tsp every 6 hours for cough  Problem # 2:  ? of COUGH VARIANT ASTHMA (ICD-493.82)  His updated medication list for this problem includes:    Ventolin Hfa 108 (90 Base) Mcg/act Aers (Albuterol sulfate) .Marland Kitchen... 2 puffs q 6 hours as needed wheezing    Qvar 80 Mcg/act Aers (Beclomethasone dipropionate) ..... Use twice daily  Prednisone 10 Mg Tabs (Prednisone) ..... One twice daily  His updated medication list for this problem includes:    Ventolin Hfa  108 (90 Base) Mcg/act Aers (Albuterol sulfate) .Marland Kitchen... 2 puffs q 6 hours as needed wheezing    Qvar 80 Mcg/act Aers (Beclomethasone dipropionate) ..... Use twice daily    Prednisone 10 Mg Tabs (Prednisone) ..... One twice daily  Problem # 3:  TOBACCO USE (ICD-305.1)  Complete Medication List: 1)  Multi Vitamin Mens Tabs (Multiple vitamin) .... Take 1 tab daily 2)  Ventolin Hfa 108 (90 Base) Mcg/act Aers (Albuterol sulfate) .... 2 puffs q 6 hours as needed wheezing 3)  Lamictal 100 Mg Tabs (Lamotrigine) .Marland Kitchen.. 1 once daily 4)  Qvar 80 Mcg/act Aers (Beclomethasone dipropionate) .... Use twice daily 5)  Hydrocodone-homatropine 5-1.5 Mg/46ml Syrp (Hydrocodone-homatropine) .... One tsp every 6 hours for cough 6)  Prednisone 10 Mg Tabs (Prednisone) .... One twice daily  Patient Instructions: 1)  Get plenty of rest, drink lots of clear liquids, and use Tylenol or Ibuprofen for fever and comfort. Return in 7-10 days if you're not better:sooner if you're feeling worse. 2)  Tobacco is very bad for your health and your loved ones! You Should stop smoking!. Prescriptions: HYDROCODONE-HOMATROPINE 5-1.5 MG/5ML SYRP (HYDROCODONE-HOMATROPINE) one tsp every 6 hours for cough  #6 oz x 0   Entered and Authorized by:   Gordy Savers  MD   Signed by:   Gordy Savers  MD on 06/23/2009   Method used:   Print then Give to Patient   RxID:   [id number] PREDNISONE 10 MG TABS (PREDNISONE) one twice daily  #14 x 0   Entered and Authorized by:   Gordy Savers  MD   Signed by:   Gordy Savers  MD on 06/23/2009   Method used:   Print then Give to Patient   RxID:   [id number]

## 2010-05-03 ENCOUNTER — Encounter (HOSPITAL_COMMUNITY): Payer: Managed Care, Other (non HMO) | Admitting: Psychiatry

## 2010-05-03 DIAGNOSIS — F39 Unspecified mood [affective] disorder: Secondary | ICD-10-CM

## 2010-05-04 ENCOUNTER — Encounter (HOSPITAL_COMMUNITY): Payer: Self-pay | Admitting: Marriage and Family Therapist

## 2010-05-06 ENCOUNTER — Inpatient Hospital Stay (HOSPITAL_COMMUNITY)
Admission: RE | Admit: 2010-05-06 | Discharge: 2010-05-09 | DRG: 882 | Disposition: A | Discharge status: Home / Self Care | Payer: Managed Care, Other (non HMO) | Source: Ambulatory Visit | Attending: Psychiatry | Admitting: Psychiatry

## 2010-05-06 DIAGNOSIS — F4310 Post-traumatic stress disorder, unspecified: Principal | ICD-10-CM

## 2010-05-06 DIAGNOSIS — J329 Chronic sinusitis, unspecified: Secondary | ICD-10-CM

## 2010-05-06 DIAGNOSIS — Z5689 Other problems related to employment: Secondary | ICD-10-CM

## 2010-05-06 DIAGNOSIS — Z9182 Personal history of military deployment: Secondary | ICD-10-CM

## 2010-05-06 DIAGNOSIS — J45909 Unspecified asthma, uncomplicated: Secondary | ICD-10-CM

## 2010-05-06 DIAGNOSIS — Z818 Family history of other mental and behavioral disorders: Secondary | ICD-10-CM

## 2010-05-07 DIAGNOSIS — F4310 Post-traumatic stress disorder, unspecified: Secondary | ICD-10-CM
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
## 2010-05-12 ENCOUNTER — Other Ambulatory Visit (HOSPITAL_COMMUNITY): Payer: Managed Care, Other (non HMO) | Attending: Psychiatry | Admitting: Psychiatry

## 2010-05-12 DIAGNOSIS — J329 Chronic sinusitis, unspecified: Secondary | ICD-10-CM | POA: Insufficient documentation

## 2010-05-12 DIAGNOSIS — F4310 Post-traumatic stress disorder, unspecified: Secondary | ICD-10-CM | POA: Insufficient documentation

## 2010-05-12 DIAGNOSIS — J45909 Unspecified asthma, uncomplicated: Secondary | ICD-10-CM | POA: Insufficient documentation

## 2010-05-13 ENCOUNTER — Other Ambulatory Visit (HOSPITAL_BASED_OUTPATIENT_CLINIC_OR_DEPARTMENT_OTHER): Payer: Managed Care, Other (non HMO) | Admitting: Psychiatry

## 2010-05-13 DIAGNOSIS — F4310 Post-traumatic stress disorder, unspecified: Secondary | ICD-10-CM

## 2010-05-14 ENCOUNTER — Other Ambulatory Visit (HOSPITAL_COMMUNITY): Payer: Managed Care, Other (non HMO) | Admitting: Psychiatry

## 2010-05-17 ENCOUNTER — Other Ambulatory Visit (HOSPITAL_COMMUNITY): Payer: Managed Care, Other (non HMO) | Admitting: Psychiatry

## 2010-05-17 ENCOUNTER — Encounter (HOSPITAL_COMMUNITY): Payer: Managed Care, Other (non HMO) | Admitting: Marriage and Family Therapist

## 2010-05-17 DIAGNOSIS — F39 Unspecified mood [affective] disorder: Secondary | ICD-10-CM

## 2010-05-17 DIAGNOSIS — F4310 Post-traumatic stress disorder, unspecified: Secondary | ICD-10-CM

## 2010-05-18 ENCOUNTER — Other Ambulatory Visit (HOSPITAL_COMMUNITY): Payer: Managed Care, Other (non HMO) | Admitting: Psychiatry

## 2010-05-19 ENCOUNTER — Other Ambulatory Visit (HOSPITAL_COMMUNITY): Payer: Managed Care, Other (non HMO) | Admitting: Psychiatry
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
## 2010-05-20 ENCOUNTER — Other Ambulatory Visit (HOSPITAL_COMMUNITY): Payer: Managed Care, Other (non HMO) | Admitting: Psychiatry

## 2010-05-21 ENCOUNTER — Other Ambulatory Visit (HOSPITAL_COMMUNITY): Payer: Managed Care, Other (non HMO) | Admitting: Psychiatry

## 2010-05-24 ENCOUNTER — Other Ambulatory Visit (HOSPITAL_COMMUNITY): Payer: Managed Care, Other (non HMO) | Admitting: Psychiatry

## 2010-05-25 ENCOUNTER — Other Ambulatory Visit (HOSPITAL_COMMUNITY): Payer: Managed Care, Other (non HMO) | Admitting: Psychiatry

## 2010-05-26 ENCOUNTER — Other Ambulatory Visit (HOSPITAL_COMMUNITY): Payer: Managed Care, Other (non HMO) | Admitting: Psychiatry

## 2010-05-27 ENCOUNTER — Other Ambulatory Visit (HOSPITAL_COMMUNITY): Payer: Managed Care, Other (non HMO) | Attending: Psychiatry | Admitting: Psychiatry

## 2010-05-27 DIAGNOSIS — J329 Chronic sinusitis, unspecified: Secondary | ICD-10-CM | POA: Insufficient documentation

## 2010-05-27 DIAGNOSIS — F4310 Post-traumatic stress disorder, unspecified: Secondary | ICD-10-CM | POA: Insufficient documentation

## 2010-05-27 DIAGNOSIS — J45909 Unspecified asthma, uncomplicated: Secondary | ICD-10-CM | POA: Insufficient documentation

## 2010-05-28 ENCOUNTER — Other Ambulatory Visit (HOSPITAL_COMMUNITY): Payer: Managed Care, Other (non HMO) | Admitting: Psychiatry

## 2010-05-31 ENCOUNTER — Other Ambulatory Visit (HOSPITAL_COMMUNITY): Payer: Managed Care, Other (non HMO) | Admitting: Psychiatry

## 2010-05-31 ENCOUNTER — Encounter (HOSPITAL_COMMUNITY): Payer: Managed Care, Other (non HMO) | Admitting: Psychiatry

## 2010-06-01 ENCOUNTER — Other Ambulatory Visit (HOSPITAL_COMMUNITY): Payer: Managed Care, Other (non HMO) | Admitting: Psychiatry

## 2010-06-02 ENCOUNTER — Other Ambulatory Visit (HOSPITAL_COMMUNITY): Payer: Managed Care, Other (non HMO) | Attending: Psychiatry | Admitting: Psychiatry

## 2010-06-02 DIAGNOSIS — J45909 Unspecified asthma, uncomplicated: Secondary | ICD-10-CM | POA: Insufficient documentation

## 2010-06-02 DIAGNOSIS — J329 Chronic sinusitis, unspecified: Secondary | ICD-10-CM | POA: Insufficient documentation

## 2010-06-02 DIAGNOSIS — F4310 Post-traumatic stress disorder, unspecified: Secondary | ICD-10-CM | POA: Insufficient documentation

## 2010-06-03 ENCOUNTER — Other Ambulatory Visit (HOSPITAL_COMMUNITY): Payer: Managed Care, Other (non HMO) | Admitting: Psychiatry

## 2010-06-04 ENCOUNTER — Other Ambulatory Visit (HOSPITAL_COMMUNITY): Payer: Managed Care, Other (non HMO) | Admitting: Psychiatry

## 2010-06-07 ENCOUNTER — Encounter (HOSPITAL_COMMUNITY): Payer: Managed Care, Other (non HMO) | Admitting: Psychiatry

## 2010-06-07 DIAGNOSIS — F39 Unspecified mood [affective] disorder: Secondary | ICD-10-CM

## 2010-06-07 LAB — CBC
MCH: 30.4 pg (ref 26.0–34.0)
MCHC: 34.2 g/dL (ref 30.0–36.0)
MCV: 88.9 fL (ref 78.0–100.0)
Platelets: 239 10*3/uL (ref 150–400)
RBC: 5.3 MIL/uL (ref 4.22–5.81)

## 2010-06-07 LAB — COMPREHENSIVE METABOLIC PANEL
Albumin: 4.3 g/dL (ref 3.5–5.2)
Alkaline Phosphatase: 53 U/L (ref 39–117)
BUN: 11 mg/dL (ref 6–23)
Chloride: 102 mEq/L (ref 96–112)
Creatinine, Ser: 0.98 mg/dL (ref 0.4–1.5)
Glucose, Bld: 100 mg/dL — ABNORMAL HIGH (ref 70–99)
Potassium: 4 mEq/L (ref 3.5–5.1)
Total Bilirubin: 1.4 mg/dL — ABNORMAL HIGH (ref 0.3–1.2)
Total Protein: 7.4 g/dL (ref 6.0–8.3)

## 2010-06-09 ENCOUNTER — Encounter (HOSPITAL_COMMUNITY): Payer: Managed Care, Other (non HMO) | Admitting: Psychiatry

## 2010-06-09 DIAGNOSIS — F3189 Other bipolar disorder: Secondary | ICD-10-CM

## 2010-06-10 ENCOUNTER — Encounter (HOSPITAL_COMMUNITY): Payer: Managed Care, Other (non HMO) | Admitting: Marriage and Family Therapist

## 2010-06-15 ENCOUNTER — Encounter (HOSPITAL_COMMUNITY): Payer: Managed Care, Other (non HMO) | Admitting: Marriage and Family Therapist

## 2010-06-30 ENCOUNTER — Encounter (HOSPITAL_COMMUNITY): Payer: Managed Care, Other (non HMO) | Admitting: Psychiatry

## 2010-06-30 DIAGNOSIS — F3189 Other bipolar disorder: Secondary | ICD-10-CM

## 2010-08-11 ENCOUNTER — Other Ambulatory Visit: Payer: Self-pay | Admitting: Internal Medicine

## 2010-08-25 ENCOUNTER — Encounter (HOSPITAL_COMMUNITY): Payer: Managed Care, Other (non HMO) | Admitting: Psychiatry

## 2010-09-01 ENCOUNTER — Encounter (HOSPITAL_COMMUNITY): Payer: Managed Care, Other (non HMO) | Admitting: Psychiatry

## 2010-09-01 DIAGNOSIS — F3189 Other bipolar disorder: Secondary | ICD-10-CM

## 2010-10-06 ENCOUNTER — Encounter (HOSPITAL_COMMUNITY): Payer: Managed Care, Other (non HMO) | Admitting: Psychiatry

## 2010-10-06 DIAGNOSIS — F3189 Other bipolar disorder: Secondary | ICD-10-CM

## 2010-10-08 NOTE — Progress Notes (Signed)
Scott Downs, Scott Downs              ACCOUNT NO.:  [id number]  MEDICAL RECORD NO.:  [id number]  LOCATION:  [facility]                           FACILITY:  BH  PHYSICIAN:  Syed T. Arfeen, M.D.   DATE OF BIRTH:  [birth year]                                PROGRESS NOTE   The patient came in today for his follow-up appointment.  He was last seen on September 01, 2010.  We started him on lithium and increased his Risperdal.  However, the patient is still using Risperdal 1 mg and stopped the lithium as he does not feel any change in his episodic anger.  The patient now decided to come off from his medication.  He feels that none of the medicine had helped him and he wants to try without medication to get his anger under control.  When I asked him how, he told that he has done okay without medication using relaxing technique and using the skills which he learned in individual counseling and psych IOP program.  The patient denies any suicidal thinking.  He reported he had never been suicidal in his life.  He continued to have episodic anger and paranoia, but reported none of the medication has helped him very much.  He had stopped taking prazosin a few weeks ago, and now he has decided to stop Risperdal and Zoloft.  I talked with him in length about possible consequences with the noncompliance with the treatment and medication.  However, the patient is determined that if he feels that he needs help or seen his episodic anger and paranoia more intense and frequent then he will call us back.  MENTAL STATUS EXAM: The patient is pleasant, cooperative.  His speech is clear and coherent. His thought process was logical, linear and goal-directed.  At this time, he does not have any auditory hallucinations, suicidal thoughts or homicidal thoughts, though he has some paranoia that people are talking about him which he has been for a long time, but overall, he is pleasant and engageable in the conversation.  There  were no delusions or psychosis present at this time.  He is alert and oriented x3.  His affect is pleasant.  His insight and judgment is fair, but he has a better and good impulse control.  DIAGNOSES: AXIS I:  Bipolar disorder, post-traumatic stress disorder.  PLAN: As mentioned above, I talked to the patient in length about the risk of noncompliance with the medication.  He agreed to cut down his Zoloft and Risperdal in half doses to see if he noticed any change in his behavior. I have recommended to take Risperdal half-tablet for at least 3 weeks and then cut down the Zoloft 1/2 tablet for another 3 weeks to see the response.  If at any time he starts noticing increased anger, agitation, depression, paranoia or anytime having suicidal thinking and homicidal thinking, then he needs to call 9-1-1 immediately or call us back or go to local ER.  We spent a good amount of time in safety plan.  The patient agreed that in case of crisis or any help, he will resume therapy treatment and make an appointment with [place].  He is  to continue to work at [employer] [place].  He likes his job in which he does not have to interfere and see a lot of people.  He is planning to move out from his home so that his wife can live in house.  He is actively looking for an apartment.  His friends are helping him to find a roommate.  Couple has decided to work together and not live part so that they continue to see their son on a regular basis.  I will give one more script of Risperdal and Zoloft with the above direction and recommended to call us back if he has any question or if he needs any help.  At this time, the patient does not want any follow-up appointment.  However, safety plan and crisis numbers are given.     Syed T. Lolly Mustache, M.D.     STA/MEDQ  D:  10/06/2010  T:  10/07/2010  Job:  [id number]  Electronically Signed by Kathryne Sharper M.D. on 10/08/2010 09:45:29 AM

## 2010-10-25 ENCOUNTER — Other Ambulatory Visit: Payer: Self-pay | Admitting: Internal Medicine

## 2010-12-29 ENCOUNTER — Encounter (HOSPITAL_COMMUNITY): Payer: Managed Care, Other (non HMO) | Admitting: Psychiatry

## 2010-12-29 DIAGNOSIS — F3189 Other bipolar disorder: Secondary | ICD-10-CM

## 2011-01-24 ENCOUNTER — Encounter (HOSPITAL_COMMUNITY): Payer: Managed Care, Other (non HMO) | Admitting: Psychiatry

## 2011-02-07 ENCOUNTER — Ambulatory Visit (INDEPENDENT_AMBULATORY_CARE_PROVIDER_SITE_OTHER): Payer: Managed Care, Other (non HMO) | Admitting: Internal Medicine

## 2011-02-07 DIAGNOSIS — Z23 Encounter for immunization: Secondary | ICD-10-CM

## 2011-02-07 DIAGNOSIS — Z0000 Encounter for general adult medical examination without abnormal findings: Secondary | ICD-10-CM

## 2011-05-02 IMAGING — CR DG CHEST 2V
2 series · 2 of 2 positions shown · non-contrast
Comparison: None

CLINICAL DATA: 2-month history of nocturnal coughing.  History of
smoking.

CHEST - 2 VIEW

[view not recorded (1 of 2)]
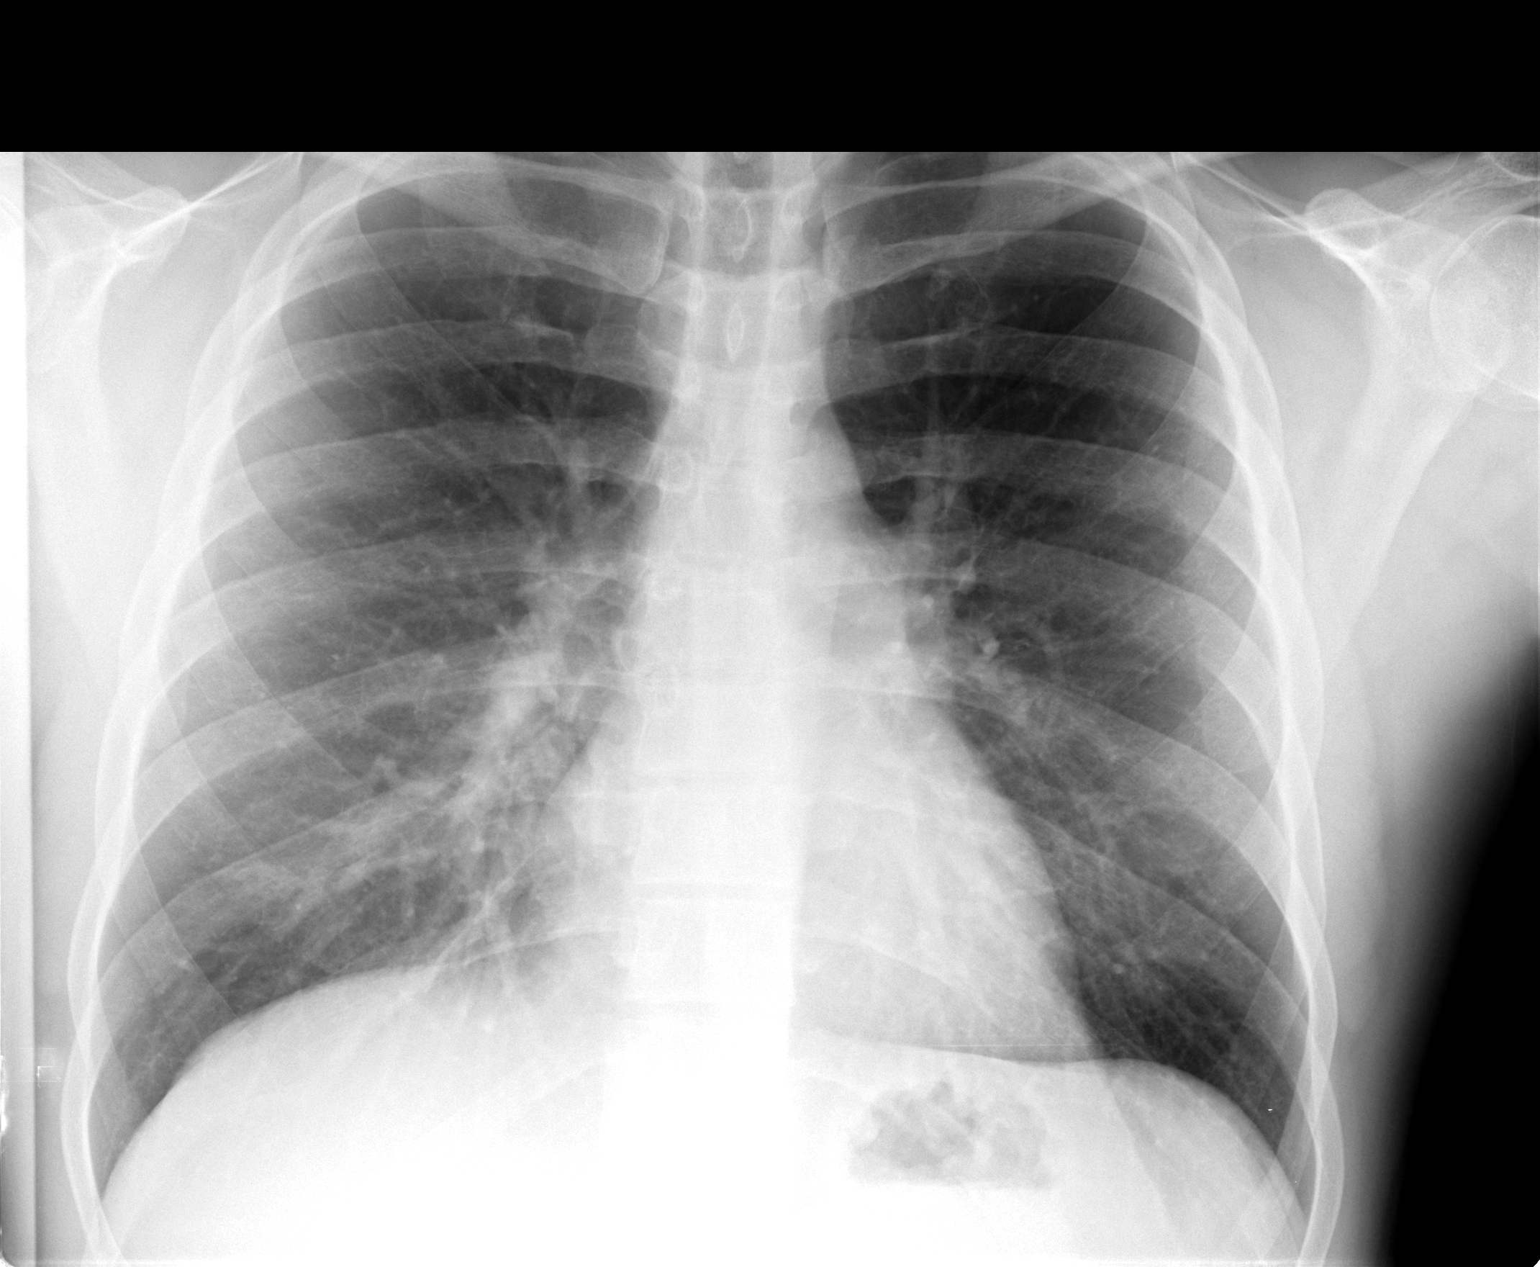

[view not recorded (2 of 2)]
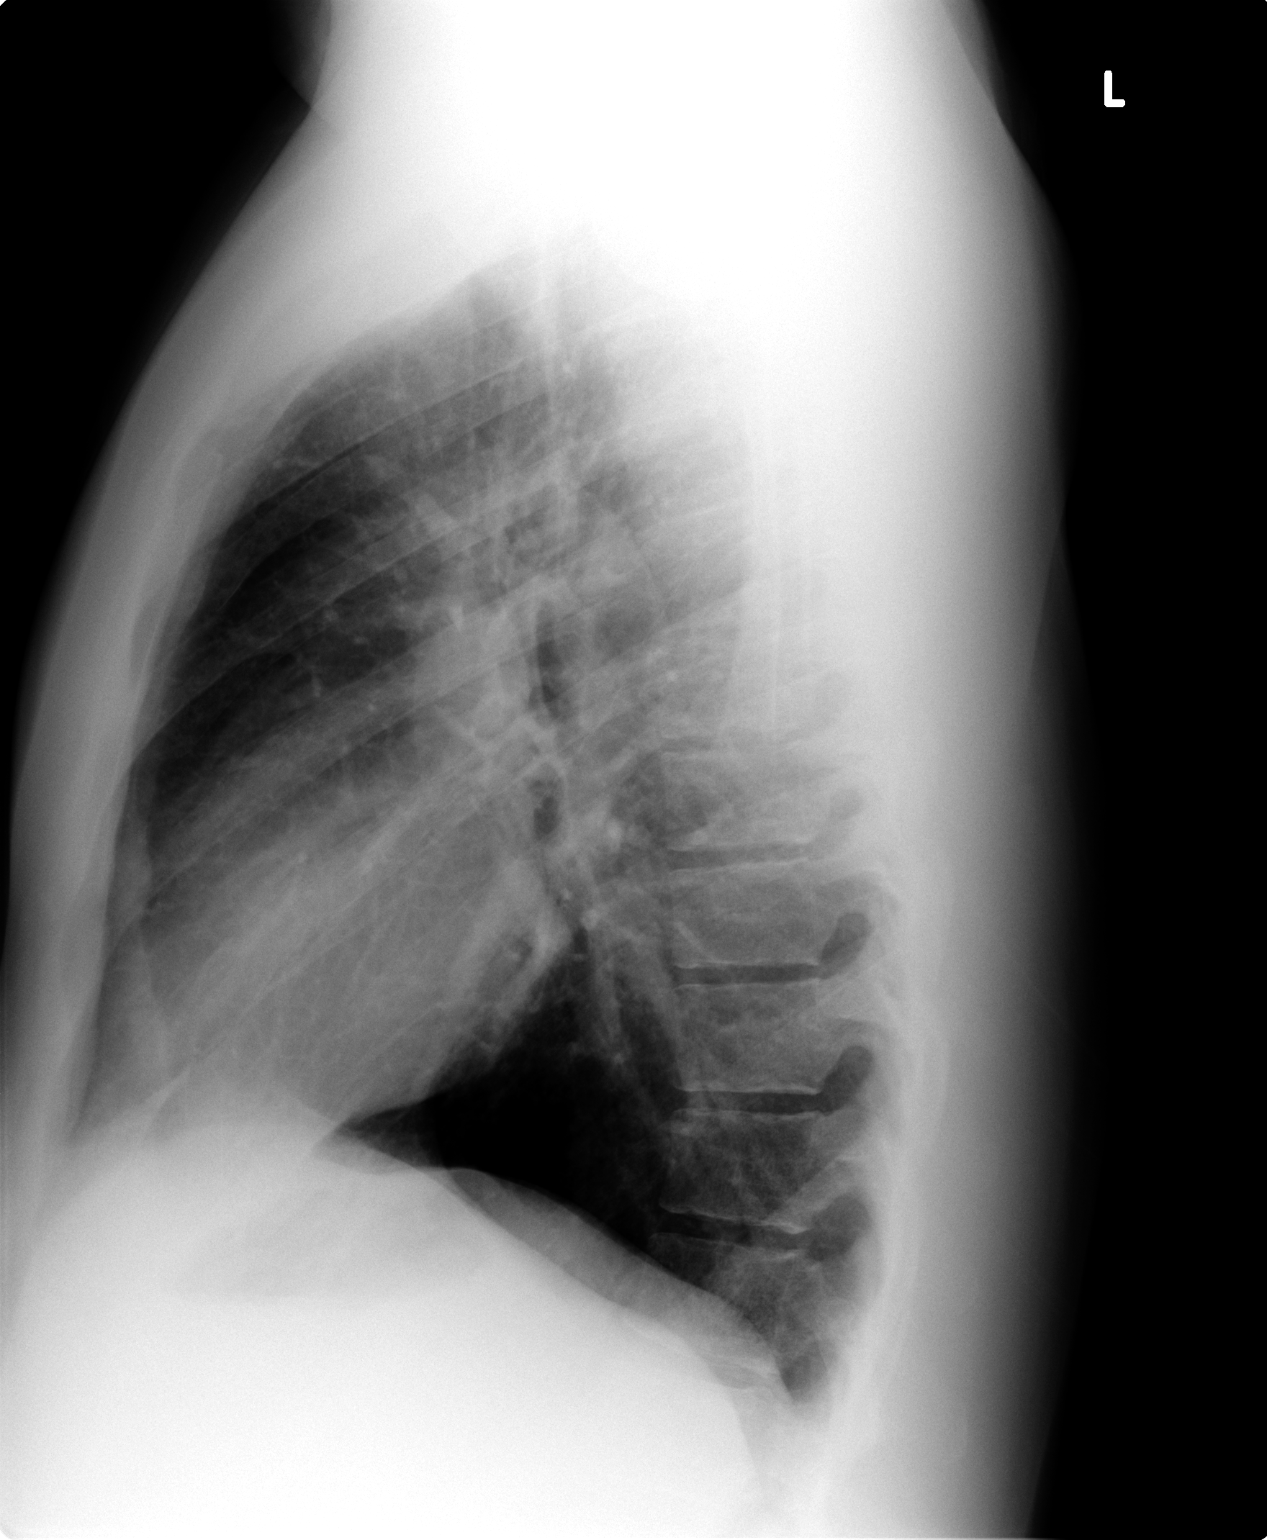

[2 of 2 positions shown; findings below may reference images not displayed]

FINDINGS: Two views of the chest were obtained.  There are a few
prominent lung markings along the right cardiac border without
focal airspace disease.  Densities along the right cardiac border
could represent atelectasis.  No evidence for pleural effusions.
Cardiomediastinal silhouette is within normal limits.  Bony
structures are intact.
IMPRESSION: No acute chest findings. Possible atelectasis along the medial
right lung base.

## 2011-06-07 ENCOUNTER — Encounter: Payer: Self-pay | Admitting: Internal Medicine

## 2011-06-07 ENCOUNTER — Ambulatory Visit (INDEPENDENT_AMBULATORY_CARE_PROVIDER_SITE_OTHER): Payer: Managed Care, Other (non HMO) | Admitting: Internal Medicine

## 2011-06-07 DIAGNOSIS — J45909 Unspecified asthma, uncomplicated: Secondary | ICD-10-CM

## 2011-06-07 DIAGNOSIS — K219 Gastro-esophageal reflux disease without esophagitis: Secondary | ICD-10-CM

## 2011-06-07 MED ORDER — BUDESONIDE-FORMOTEROL FUMARATE 160-4.5 MCG/ACT IN AERO: 2.0000 | INHALATION_SPRAY | Freq: Two times a day (BID) | RESPIRATORY_TRACT | Status: DC

## 2011-06-07 NOTE — Progress Notes (Signed)
  Subjective:    Patient ID: Scott Downs, male    DOB: June 15, 1980, 30 y.o.   MRN: [id number]  HPI 31 year old patient has a history of asthma. For the past 2 or 3 months she has had increasing wheezing and increasing the albuterol use. Prior to this period of time he has required little if any rescue albuterol. He has discontinued tobacco use at the first of the year. He's also had more reflux symptoms. He is being followed closely by psychiatry    Review of Systems  Constitutional: Negative for fever, chills, appetite change and fatigue.  HENT: Negative for hearing loss, ear pain, congestion, sore throat, trouble swallowing, neck stiffness, dental problem, voice change and tinnitus.   Eyes: Negative for pain, discharge and visual disturbance.  Respiratory: Positive for cough and wheezing. Negative for chest tightness and stridor.   Cardiovascular: Negative for chest pain, palpitations and leg swelling.  Gastrointestinal: Positive for abdominal pain. Negative for nausea, vomiting, diarrhea, constipation, blood in stool and abdominal distention.  Genitourinary: Negative for urgency, hematuria, flank pain, discharge, difficulty urinating and genital sores.  Musculoskeletal: Negative for myalgias, back pain, joint swelling, arthralgias and gait problem.  Skin: Negative for rash.  Neurological: Negative for dizziness, syncope, speech difficulty, weakness, numbness and headaches.  Hematological: Negative for adenopathy. Does not bruise/bleed easily.  Psychiatric/Behavioral: Negative for behavioral problems and dysphoric mood. The patient is not nervous/anxious.        Objective:   Physical Exam  Constitutional: He is oriented to person, place, and time. He appears well-developed.  HENT:  Head: Normocephalic.  Right Ear: External ear normal.  Left Ear: External ear normal.  Eyes: Conjunctivae and EOM are normal.  Neck: Normal range of motion.  Cardiovascular: Normal rate and normal  heart sounds.   Pulmonary/Chest: Effort normal and breath sounds normal. No respiratory distress. He has no wheezes. He has no rales.  Abdominal: Bowel sounds are normal.  Musculoskeletal: Normal range of motion. He exhibits no edema and no tenderness.  Neurological: He is alert and oriented to person, place, and time.  Psychiatric: He has a normal mood and affect. His behavior is normal.          Assessment & Plan:   Asthma. We'll treat with an aggressive anti-reflux regimen and discontinue Qvar. We'll substitute Symbicort and recheck in 1 month Gastroesophageal reflux disease. Antireflux measures discussed we'll treat with short term PPI therapy

## 2011-06-07 NOTE — Patient Instructions (Signed)
Avoids foods high in acid such as tomatoes citrus juices, and spicy foods.  Avoid eating within two hours of lying down or before exercising.  Do not overheat.  Try smaller more frequent meals.  If symptoms persist, elevate the head of her bed 12 inches while sleeping.  Gastroesophageal Reflux Disease, Adult Gastroesophageal reflux disease (GERD) happens when acid from your stomach flows up into the esophagus. When acid comes in contact with the esophagus, the acid causes soreness (inflammation) in the esophagus. Over time, GERD may create small holes (ulcers) in the lining of the esophagus. CAUSES    Increased body weight. This puts pressure on the stomach, making acid rise from the stomach into the esophagus.   Smoking. This increases acid production in the stomach.   Drinking alcohol. This causes decreased pressure in the lower esophageal sphincter (valve or ring of muscle between the esophagus and stomach), allowing acid from the stomach into the esophagus.   Late evening meals and a full stomach. This increases pressure and acid production in the stomach.   A malformed lower esophageal sphincter.  Sometimes, no cause is found. SYMPTOMS    Burning pain in the lower part of the mid-chest behind the breastbone and in the mid-stomach area. This may occur twice a week or more often.   Trouble swallowing.   Sore throat.   Dry cough.   Asthma-like symptoms including chest tightness, shortness of breath, or wheezing.  DIAGNOSIS   Your caregiver may be able to diagnose GERD based on your symptoms. In some cases, X-rays and other tests may be done to check for complications or to check the condition of your stomach and esophagus. TREATMENT   Your caregiver may recommend over-the-counter or prescription medicines to help decrease acid production. Ask your caregiver before starting or adding any new medicines.   HOME CARE INSTRUCTIONS    Change the factors that you can control. Ask your  caregiver for guidance concerning weight loss, quitting smoking, and alcohol consumption.   Avoid foods and drinks that make your symptoms worse, such as:   Caffeine or alcoholic drinks.   Chocolate.   Peppermint or mint flavorings.   Garlic and onions.   Spicy foods.   Citrus fruits, such as oranges, lemons, or limes.   Tomato-based foods such as sauce, chili, salsa, and pizza.   Fried and fatty foods.   Avoid lying down for the 3 hours prior to your bedtime or prior to taking a nap.   Eat small, frequent meals instead of large meals.   Wear loose-fitting clothing. Do not wear anything tight around your waist that causes pressure on your stomach.   Raise the head of your bed 6 to 8 inches with wood blocks to help you sleep. Extra pillows will not help.   Only take over-the-counter or prescription medicines for pain, discomfort, or fever as directed by your caregiver.   Do not take aspirin, ibuprofen, or other nonsteroidal anti-inflammatory drugs (NSAIDs).  SEEK IMMEDIATE MEDICAL CARE IF:    You have pain in your arms, neck, jaw, teeth, or back.   Your pain increases or changes in intensity or duration.   You develop nausea, vomiting, or sweating (diaphoresis).   You develop shortness of breath, or you faint.   Your vomit is green, yellow, black, or looks like coffee grounds or blood.   Your stool is red, bloody, or black.  These symptoms could be signs of other problems, such as heart disease, gastric bleeding, or esophageal  bleeding. MAKE SURE YOU:    Understand these instructions.   Will watch your condition.   Will get help right away if you are not doing well or get worse.  Document Released: 12/22/2004 Document Revised: 03/03/2011 Document Reviewed: 10/01/2010 [facility] Patient Information 2012 [place], [place].

## 2011-06-12 ENCOUNTER — Other Ambulatory Visit: Payer: Self-pay | Admitting: Internal Medicine

## 2011-10-28 ENCOUNTER — Telehealth: Payer: Self-pay | Admitting: Internal Medicine

## 2011-10-28 MED ORDER — DOXYCYCLINE HYCLATE 100 MG PO TABS: 100.0000 mg | ORAL_TABLET | Freq: Two times a day (BID) | ORAL | Status: AC

## 2011-10-28 NOTE — Telephone Encounter (Signed)
Doxycycline 100 mg #20 one twice a day Mucinex D twice daily  Use saline irrigation, warm  moist compresses and over-the-counter decongestants only as directed.  Call if there is no improvement in 5 to 7 days, or sooner if you develop increasing pain, fever, or any new symptoms.

## 2011-10-28 NOTE — Telephone Encounter (Signed)
Pt has sinus infection with cough and yellow nasal discharge.[facility]. Pt has history of sinus infections

## 2011-10-28 NOTE — Telephone Encounter (Signed)
Sinus headache , drainage , slight productive cough - wanted appt - none avaible today - please advise work in or rx to call out?

## 2011-10-28 NOTE — Telephone Encounter (Signed)
Spoke with pt - informed of dr. Vernon Prey instructions and med to be sent to [facility]

## 2011-11-17 ENCOUNTER — Other Ambulatory Visit: Payer: Self-pay | Admitting: Internal Medicine

## 2011-12-29 ENCOUNTER — Ambulatory Visit (INDEPENDENT_AMBULATORY_CARE_PROVIDER_SITE_OTHER): Payer: Managed Care, Other (non HMO) | Admitting: Internal Medicine

## 2011-12-29 DIAGNOSIS — Z23 Encounter for immunization: Secondary | ICD-10-CM

## 2012-02-02 ENCOUNTER — Other Ambulatory Visit (HOSPITAL_COMMUNITY): Payer: Self-pay | Admitting: Psychiatry

## 2012-02-02 ENCOUNTER — Telehealth (HOSPITAL_COMMUNITY): Payer: Self-pay

## 2012-02-02 NOTE — Telephone Encounter (Signed)
Call returned.  Patient was seen in this office in 2012.  Patient endorse increased depression anxiety and wants to be admitted.  Patient is service-connected at [facility] hospital.  Patient like to go [facility] however he is wondering if he can admit at [facility].  I suggested to try a [facility] since he is service-connected there.  Patient denies any suicidal thinking her homicidal thinking.

## 2012-02-22 ENCOUNTER — Encounter: Payer: Self-pay | Admitting: Internal Medicine

## 2012-02-22 ENCOUNTER — Ambulatory Visit (INDEPENDENT_AMBULATORY_CARE_PROVIDER_SITE_OTHER): Payer: Managed Care, Other (non HMO) | Admitting: Internal Medicine

## 2012-02-22 VITALS — BP 160/86 | HR 76 | Temp 98.4°F | Resp 18 | Wt 234.0 lb

## 2012-02-22 DIAGNOSIS — B351 Tinea unguium: Secondary | ICD-10-CM

## 2012-02-22 MED ORDER — TERBINAFINE HCL 250 MG PO TABS: 250.0000 mg | ORAL_TABLET | Freq: Every day | ORAL | Status: DC

## 2012-02-22 NOTE — Progress Notes (Signed)
Subjective:    Patient ID: Scott Downs, male    DOB: 03-07-1981, 31 y.o.   MRN: [id number]  HPI  31 year old patient who is seen today complaining of a toenail fungus. He has tried a number of topical medications for toenails that have become discolored and thickened.  Past Medical History  Diagnosis Date  . Allergy   . Asthma     History   Social History  . Marital Status: Married    Spouse Name: N/A    Number of Children: N/A  . Years of Education: N/A   Occupational History  . Not on file.   Social History Main Topics  . Smoking status: Former Smoker    Quit date: 03/29/2011  . Smokeless tobacco: Never Used  . Alcohol Use: Yes     Comment: rarely  . Drug Use: No  . Sexually Active: Not on file   Other Topics Concern  . Not on file   Social History Narrative  . No narrative on file    Past Surgical History  Procedure Date  . Nasal septum surgery   . Wrist fracture surgery     Family History  Problem Relation Age of Onset  . Cancer Mother     ovarian ca  . Heart disease Father     No Known Allergies  Current Outpatient Prescriptions on File Prior to Visit  Medication Sig Dispense Refill  . Multiple Vitamin (MULTIVITAMIN) tablet Take 1 tablet by mouth daily.      Marland Kitchen NASONEX 50 MCG/ACT nasal spray USE 2 SPRAYS IN EACH NOSTRIL ONCE DAILY  17 g  3  . prazosin (MINIPRESS) 1 MG capsule Take 1 mg by mouth at bedtime.      . risperiDONE (RISPERDAL) 1 MG tablet Take 1 mg by mouth daily.      . SYMBICORT 160-4.5 MCG/ACT inhaler INHALE 2 PUFFS BY MOUTH TWICE A DAY  10.2 g  1  . VENTOLIN HFA 108 (90 BASE) MCG/ACT inhaler USE 2 PUFFS EVERY 6 HOURS AS NEEDED FOR WHEEZING  2 Inhaler  1    BP 160/86  Pulse 76  Temp 98.4 F (36.9 C) (Oral)  Resp 18  Wt 234 lb (106.142 kg)       Review of Systems  Constitutional: Negative for fever, chills, appetite change and fatigue.  HENT: Negative for hearing loss, ear pain, congestion, sore throat, trouble  swallowing, neck stiffness, dental problem, voice change and tinnitus.   Eyes: Negative for pain, discharge and visual disturbance.  Respiratory: Negative for cough, chest tightness, wheezing and stridor.   Cardiovascular: Negative for chest pain, palpitations and leg swelling.  Gastrointestinal: Negative for nausea, vomiting, abdominal pain, diarrhea, constipation, blood in stool and abdominal distention.  Genitourinary: Negative for urgency, hematuria, flank pain, discharge, difficulty urinating and genital sores.  Musculoskeletal: Negative for myalgias, back pain, joint swelling, arthralgias and gait problem.  Skin: Negative for rash.       Toenail changes  Neurological: Negative for dizziness, syncope, speech difficulty, weakness, numbness and headaches.  Hematological: Negative for adenopathy. Does not bruise/bleed easily.  Psychiatric/Behavioral: Negative for behavioral problems and dysphoric mood. The patient is not nervous/anxious.        Objective:   Physical Exam  Constitutional: He appears well-developed and well-nourished. No distress.  Skin:       Right foot and toenails unremarkable. 3 of  5 toenails were slightly discolored and thickened most marked involving the left great toe  Assessment & Plan:    Toenail onychomycosis. Options were discussed including no treatment and keeping the nails trimmed. Risks and benefits of oral antifungal medication discussed including serious liver toxicity. He wishes to start treatment with Lamisil for 12 weeks. Prescription dispensed

## 2012-02-22 NOTE — Patient Instructions (Addendum)
Call or return to clinic prn if these symptoms worsen or fail to improve as anticipated. Ringworm, Nail A fungal infection of the nail (tinea unguium/onychomycosis) is common. It is common as the visible part of the nail is composed of dead cells which have no blood supply to help prevent infection. It occurs because fungi are everywhere and will pick any opportunity to grow on any dead material. Because nails are very slow growing they require up to 2 years of treatment with anti-fungal medications. The entire nail back to the base is infected. This includes approximately  of the nail which you cannot see. If your caregiver has prescribed a medication by mouth, take it every day and as directed. No progress will be seen for at least 6 to 9 months. Do not be disappointed! Because fungi live on dead cells with little or no exposure to blood supply, medication delivery to the infection is slow; thus the cure is slow. It is also why you can observe no progress in the first 6 months. The nail becoming cured is the base of the nail, as it has the blood supply. Topical medication such as creams and ointments are usually not effective. Important in successful treatment of nail fungus is closely following the medication regimen that your doctor prescribes. Sometimes you and your caregiver may elect to speed up this process by surgical removal of all the nails. Even this may still require 6 to 9 months of additional oral medications. See your caregiver as directed. Remember there will be no visible improvement for at least 6 months. See your caregiver sooner if other signs of infection (redness and swelling) develop. Document Released: 03/11/2000 Document Revised: 06/06/2011 Document Reviewed: 05/20/2008 [facility] Patient Information 2013 [place], [place].

## 2012-05-23 ENCOUNTER — Encounter: Payer: Self-pay | Admitting: Family Medicine

## 2012-05-23 ENCOUNTER — Ambulatory Visit (INDEPENDENT_AMBULATORY_CARE_PROVIDER_SITE_OTHER): Payer: Managed Care, Other (non HMO) | Admitting: Family Medicine

## 2012-05-23 VITALS — BP 100/70 | Temp 99.4°F | Wt 209.0 lb

## 2012-05-23 DIAGNOSIS — R300 Dysuria: Secondary | ICD-10-CM

## 2012-05-23 LAB — POCT URINALYSIS DIPSTICK
Glucose, UA: NEGATIVE
Leukocytes, UA: NEGATIVE
Protein, UA: NEGATIVE
Spec Grav, UA: 1.01
Urobilinogen, UA: 0.2

## 2012-05-23 NOTE — Patient Instructions (Addendum)
Follow up promptly for any fever or worsening abdominal pain.

## 2012-05-23 NOTE — Progress Notes (Signed)
  Subjective:    Patient ID: Scott Downs, male    DOB: 1980-05-25, 32 y.o.   MRN: [id number]  HPI Patient has history of onychomycosis. About 11 weeks ago started Lamisil. Seen today with dark-colored urine but no burning with urination. No fever. He had one week history of relatively constant mild ache right upper quadrant with some radiation toward back. No nausea or vomiting. No fever. No chills. No relation to eating. No triggers. He wonders if musculoskeletal but denies injury. No rash. No pulmonary symptoms such as dyspnea or cough. No pleuritic pain.   Review of Systems  Constitutional: Negative for fever, chills and appetite change.  Respiratory: Negative for cough and shortness of breath.   Cardiovascular: Negative for chest pain.  Gastrointestinal: Positive for abdominal pain. Negative for nausea and vomiting.  Genitourinary: Negative for hematuria.       Objective:   Physical Exam  Constitutional: He appears well-developed and well-nourished.  HENT:  Mouth/Throat: Oropharynx is clear and moist.  Eyes: No scleral icterus.  Cardiovascular: Normal rate and regular rhythm.   Pulmonary/Chest: Effort normal and breath sounds normal. No respiratory distress. He has no wheezes. He has no rales.  Abdominal: Soft. Bowel sounds are normal. He exhibits no distension and no mass. There is no rebound and no guarding.  Very minimal tenderness right upper quadrant to deep palpation          Assessment & Plan:  Mild right upper quadrant pain. Suspect musculoskeletal. Doubt symptomatic cholelithiasis. Symptoms relatively constant over one week. Patient is on Lamisil but doubt hepatic related. No jaundice or scleral icterus. Urine dipstick does not reveal bilirubinuria. Check hepatic panel. Consider ultrasound if symptoms persist

## 2012-05-24 LAB — HEPATIC FUNCTION PANEL
Bilirubin, Direct: 0.1 mg/dL (ref 0.0–0.3)
Total Bilirubin: 1.5 mg/dL — ABNORMAL HIGH (ref 0.3–1.2)

## 2012-05-24 NOTE — Progress Notes (Signed)
Quick Note:  Pt informed on personally identified VM ______ 

## 2012-08-11 ENCOUNTER — Other Ambulatory Visit: Payer: Self-pay | Admitting: Internal Medicine

## 2012-11-27 ENCOUNTER — Telehealth (HOSPITAL_COMMUNITY): Payer: Self-pay

## 2012-12-11 ENCOUNTER — Ambulatory Visit (INDEPENDENT_AMBULATORY_CARE_PROVIDER_SITE_OTHER): Payer: Managed Care, Other (non HMO)

## 2012-12-11 DIAGNOSIS — Z23 Encounter for immunization: Secondary | ICD-10-CM

## 2013-02-01 ENCOUNTER — Telehealth: Payer: Self-pay | Admitting: Internal Medicine

## 2013-02-01 NOTE — Telephone Encounter (Signed)
Please schedule lab appointment and have pt bring order with.

## 2013-02-01 NOTE — Telephone Encounter (Signed)
Pt has lab order rx from his psychiatry. Pt would like to bring order and have labs drawn here. Can I sch?

## 2013-02-01 NOTE — Telephone Encounter (Signed)
yes

## 2013-02-01 NOTE — Telephone Encounter (Signed)
Lab appt made

## 2013-02-01 NOTE — Telephone Encounter (Signed)
lmom for pt to call back

## 2013-02-06 ENCOUNTER — Other Ambulatory Visit (INDEPENDENT_AMBULATORY_CARE_PROVIDER_SITE_OTHER): Payer: Managed Care, Other (non HMO)

## 2013-02-06 DIAGNOSIS — Z79899 Other long term (current) drug therapy: Secondary | ICD-10-CM

## 2013-02-06 LAB — LIPID PANEL
Cholesterol: 171 mg/dL (ref 0–200)
HDL: 42.6 mg/dL (ref >39.00)
LDL Cholesterol: 103 mg/dL — ABNORMAL HIGH (ref 0–99)
Total CHOL/HDL Ratio: 4
Triglycerides: 125 mg/dL (ref 0.0–149.0)
VLDL: 25 mg/dL (ref 0.0–40.0)

## 2013-10-11 ENCOUNTER — Encounter: Payer: Self-pay | Admitting: Internal Medicine

## 2013-10-11 ENCOUNTER — Ambulatory Visit (INDEPENDENT_AMBULATORY_CARE_PROVIDER_SITE_OTHER): Payer: Managed Care, Other (non HMO) | Admitting: Internal Medicine

## 2013-10-11 VITALS — BP 130/88 | HR 102 | Temp 98.5°F | Resp 20 | Ht 72.0 in | Wt 226.0 lb

## 2013-10-11 DIAGNOSIS — J45909 Unspecified asthma, uncomplicated: Secondary | ICD-10-CM

## 2013-10-11 DIAGNOSIS — B351 Tinea unguium: Secondary | ICD-10-CM

## 2013-10-11 MED ORDER — TERBINAFINE HCL 250 MG PO TABS: 250.0000 mg | ORAL_TABLET | Freq: Every day | ORAL | Status: DC

## 2013-10-11 NOTE — Patient Instructions (Signed)
Ringworm, Nail A fungal infection of the nail (tinea unguium/onychomycosis) is common. It is common as the visible part of the nail is composed of dead cells which have no blood supply to help prevent infection. It occurs because fungi are everywhere and will pick any opportunity to grow on any dead material. Because nails are very slow growing they require up to 2 years of treatment with anti-fungal medications. The entire nail back to the base is infected. This includes approximately  of the nail which you cannot see. If your caregiver has prescribed a medication by mouth, take it every day and as directed. No progress will be seen for at least 6 to 9 months. Do not be disappointed! Because fungi live on dead cells with little or no exposure to blood supply, medication delivery to the infection is slow; thus the cure is slow. It is also why you can observe no progress in the first 6 months. The nail becoming cured is the base of the nail, as it has the blood supply. Topical medication such as creams and ointments are usually not effective. Important in successful treatment of nail fungus is closely following the medication regimen that your doctor prescribes. Sometimes you and your caregiver may elect to speed up this process by surgical removal of all the nails. Even this may still require 6 to 9 months of additional oral medications. See your caregiver as directed. Remember there will be no visible improvement for at least 6 months. See your caregiver sooner if other signs of infection (redness and swelling) develop. Document Released: 03/11/2000 Document Revised: 06/06/2011 Document Reviewed: 05/20/2008 [employer] Patient Information 2015 [employer]. This information is not intended to replace advice given to you by your health care provider. Make sure you discuss any questions you have with your health care provider.  

## 2013-10-11 NOTE — Progress Notes (Signed)
Pre visit review using our clinic review tool, if applicable. No additional management support is needed unless otherwise documented below in the visit note. 

## 2013-10-11 NOTE — Progress Notes (Signed)
Subjective:    Patient ID: Scott Downs, male    DOB: 08/04/1980, 33 y.o.   MRN: [id number]  HPI   33 year old patienthas a history of stable asthma.  He also has a history of toenail onychomycosis involving 3 of 5 toes of the left foot.  This was treated in 2013 and he had complete resolution.  He had most of significant involvement of the left great toenail.  For the past several months he has had onychomycotic changes involving his left third toenail only.  He had no side effects with prior treatment and is aware of rare liver toxicity  Past Medical History  Diagnosis Date  . Allergy   . Asthma     History   Social History  . Marital Status: Married    Spouse Name: N/A    Number of Children: N/A  . Years of Education: N/A   Occupational History  . Not on file.   Social History Main Topics  . Smoking status: Former Smoker    Quit date: 03/29/2011  . Smokeless tobacco: Never Used  . Alcohol Use: Yes     Comment: rarely  . Drug Use: No  . Sexual Activity: Not on file   Other Topics Concern  . Not on file   Social History Narrative  . No narrative on file    Past Surgical History  Procedure Laterality Date  . Nasal septum surgery    . Wrist fracture surgery      Family History  Problem Relation Age of Onset  . Cancer Mother     ovarian ca  . Heart disease Father     No Known Allergies  Current Outpatient Prescriptions on File Prior to Visit  Medication Sig Dispense Refill  . Multiple Vitamin (MULTIVITAMIN) tablet Take 1 tablet by mouth daily.      Marland Kitchen NASONEX 50 MCG/ACT nasal spray USE 2 SPRAYS IN EACH NOSTRIL ONCE DAILY  17 g  3  . SYMBICORT 160-4.5 MCG/ACT inhaler INHALE 2 PUFFS BY MOUTH TWICE A DAY  10.2 g  5  . VENTOLIN HFA 108 (90 BASE) MCG/ACT inhaler USE 2 PUFFS EVERY 6 HOURS AS NEEDED FOR WHEEZING  2 Inhaler  1   No current facility-administered medications on file prior to visit.    BP 130/88  Pulse 102  Temp(Src) 98.5 F (36.9 C)  (Oral)  Resp 20  Ht 6' (1.829 m)  Wt 226 lb (102.513 kg)  BMI 30.64 kg/m2  SpO2 98%      Review of Systems  Constitutional: Negative for fever, chills, appetite change and fatigue.  HENT: Negative for congestion, dental problem, ear pain, hearing loss, sore throat, tinnitus, trouble swallowing and voice change.   Eyes: Negative for pain, discharge and visual disturbance.  Respiratory: Negative for cough, chest tightness, wheezing and stridor.   Cardiovascular: Negative for chest pain, palpitations and leg swelling.  Gastrointestinal: Negative for nausea, vomiting, abdominal pain, diarrhea, constipation, blood in stool and abdominal distention.  Genitourinary: Negative for urgency, hematuria, flank pain, discharge, difficulty urinating and genital sores.  Musculoskeletal: Negative for arthralgias, back pain, gait problem, joint swelling, myalgias and neck stiffness.  Skin: Negative for rash.       Onychomycotic nail changes, left third toenail  Neurological: Negative for dizziness, syncope, speech difficulty, weakness, numbness and headaches.  Hematological: Negative for adenopathy. Does not bruise/bleed easily.  Psychiatric/Behavioral: Negative for behavioral problems and dysphoric mood. The patient is not nervous/anxious.  Objective:   Physical Exam  Constitutional: He appears well-developed and well-nourished. No distress.  Repeat blood pressure 130/80  Pulmonary/Chest: Effort normal and breath sounds normal. No respiratory distress. He has no wheezes. He has no rales.  Skin:  The left third toenail was distorted and discolored with nail thickening consistent with toenail onychomycosis          Assessment & Plan:  Toenail onychomycosis.  Will retreat with Lamisil, which has been effective in the past Asthma stable

## 2013-12-21 ENCOUNTER — Encounter: Payer: Self-pay | Admitting: Gastroenterology

## 2014-11-04 ENCOUNTER — Encounter: Payer: Self-pay | Admitting: Internal Medicine

## 2014-11-04 ENCOUNTER — Ambulatory Visit (INDEPENDENT_AMBULATORY_CARE_PROVIDER_SITE_OTHER): Payer: Managed Care, Other (non HMO) | Admitting: Internal Medicine

## 2014-11-04 VITALS — BP 114/77 | HR 100 | Temp 98.8°F | Ht 72.0 in | Wt 250.0 lb

## 2014-11-04 DIAGNOSIS — J4520 Mild intermittent asthma, uncomplicated: Secondary | ICD-10-CM

## 2014-11-04 NOTE — Progress Notes (Signed)
Pre visit review using our clinic review tool, if applicable. No additional management support is needed unless otherwise documented below in the visit note. 

## 2014-11-04 NOTE — Progress Notes (Signed)
Subjective:    Patient ID: Scott Downs, male    DOB: 1981/02/16, 34 y.o.   MRN: [id number]  HPI 34 year old patient who is seen today in follow-up.  He has a history of asthma. He is doing quite well.  His only complaint is excessive perspiration that causes some social embarrassment.  This has been a chronic problem and seems unrelated to present medical regimen  Past Medical History  Diagnosis Date  . Allergy   . Asthma     History   Social History  . Marital Status: Married    Spouse Name: N/A  . Number of Children: N/A  . Years of Education: N/A   Occupational History  . Not on file.   Social History Main Topics  . Smoking status: Former Smoker    Quit date: 03/29/2011  . Smokeless tobacco: Never Used  . Alcohol Use: 0.0 oz/week    0 Standard drinks or equivalent per week     Comment: rarely  . Drug Use: No  . Sexual Activity: Not on file   Other Topics Concern  . Not on file   Social History Narrative    Past Surgical History  Procedure Laterality Date  . Nasal septum surgery    . Wrist fracture surgery      Family History  Problem Relation Age of Onset  . Cancer Mother     ovarian ca  . Heart disease Father     No Known Allergies  Current Outpatient Prescriptions on File Prior to Visit  Medication Sig Dispense Refill  . lamoTRIgine (LAMICTAL) 200 MG tablet Take 200 mg by mouth daily.     . Multiple Vitamin (MULTIVITAMIN) tablet Take 1 tablet by mouth daily.    Marland Kitchen NASONEX 50 MCG/ACT nasal spray USE 2 SPRAYS IN EACH NOSTRIL ONCE DAILY 17 g 3  . SEROQUEL XR 400 MG 24 hr tablet Take 400 mg by mouth at bedtime.     . SYMBICORT 160-4.5 MCG/ACT inhaler INHALE 2 PUFFS BY MOUTH TWICE A DAY 10.2 g 5  . VENTOLIN HFA 108 (90 BASE) MCG/ACT inhaler USE 2 PUFFS EVERY 6 HOURS AS NEEDED FOR WHEEZING (Patient not taking: Reported on 11/04/2014) 2 Inhaler 1   No current facility-administered medications on file prior to visit.    BP 114/77 mmHg  Pulse 100   Temp(Src) 98.8 F (37.1 C)  Ht 6' (1.829 m)  Wt 250 lb (113.399 kg)  BMI 33.90 kg/m2       Review of Systems  Constitutional: Negative for fever, chills, appetite change and fatigue.  HENT: Negative for congestion, dental problem, ear pain, hearing loss, sore throat, tinnitus, trouble swallowing and voice change.   Eyes: Negative for pain, discharge and visual disturbance.  Respiratory: Negative for cough, chest tightness, wheezing and stridor.   Cardiovascular: Negative for chest pain, palpitations and leg swelling.  Gastrointestinal: Negative for nausea, vomiting, abdominal pain, diarrhea, constipation, blood in stool and abdominal distention.  Genitourinary: Negative for urgency, hematuria, flank pain, discharge, difficulty urinating and genital sores.  Musculoskeletal: Negative for myalgias, back pain, joint swelling, arthralgias, gait problem and neck stiffness.  Skin: Negative for rash.  Neurological: Negative for dizziness, syncope, speech difficulty, weakness, numbness and headaches.  Hematological: Negative for adenopathy. Does not bruise/bleed easily.  Psychiatric/Behavioral: Negative for behavioral problems and dysphoric mood. The patient is not nervous/anxious.        Objective:   Physical Exam  Constitutional: He is oriented to person, place, and time. He  appears well-developed.  HENT:  Head: Normocephalic.  Right Ear: External ear normal.  Left Ear: External ear normal.  Eyes: Conjunctivae and EOM are normal.  Neck: Normal range of motion.  Cardiovascular: Normal rate and normal heart sounds.   Pulmonary/Chest: Breath sounds normal. No respiratory distress. He has no wheezes.  Abdominal: Bowel sounds are normal.  Musculoskeletal: Normal range of motion. He exhibits no edema or tenderness.  Neurological: He is alert and oriented to person, place, and time.  Psychiatric: He has a normal mood and affect. His behavior is normal.          Assessment & Plan:    asthma, stable  Excessive perspiration.    Discussed at length.  We'll attempt to avoid precipitating factors

## 2014-11-04 NOTE — Patient Instructions (Signed)
Call or return to clinic prn if these symptoms worsen or fail to improve as anticipated.

## 2018-02-13 ENCOUNTER — Ambulatory Visit: Payer: Self-pay | Admitting: Physician Assistant

## 2018-04-10 ENCOUNTER — Other Ambulatory Visit: Payer: Self-pay | Admitting: Physician Assistant

## 2018-04-10 NOTE — Telephone Encounter (Signed)
Need to review paper chart  

## 2018-05-14 ENCOUNTER — Encounter: Payer: Self-pay | Admitting: *Deleted

## 2018-05-14 ENCOUNTER — Emergency Department
Admission: EM | Admit: 2018-05-14 | Discharge: 2018-05-14 | Disposition: A | Discharge status: Home / Self Care | Payer: BLUE CROSS/BLUE SHIELD | Source: Home / Self Care

## 2018-05-14 DIAGNOSIS — S61213A Laceration without foreign body of left middle finger without damage to nail, initial encounter: Secondary | ICD-10-CM

## 2018-05-14 DIAGNOSIS — Z23 Encounter for immunization: Secondary | ICD-10-CM | POA: Diagnosis not present

## 2018-05-14 MED ORDER — TETANUS-DIPHTH-ACELL PERTUSSIS 5-2.5-18.5 LF-MCG/0.5 IM SUSP
0.5000 mL | Freq: Once | INTRAMUSCULAR | Status: AC
  Administered 2018-05-14: 0.5 mL via INTRAMUSCULAR

## 2018-05-14 NOTE — ED Provider Notes (Signed)
[facility]    CSN: [id number] Arrival date & time: 05/14/18  0846     History   Chief Complaint Chief Complaint  Patient presents with  . Laceration    HPI Scott Downs is a 38 y.o. male.   HPI  Scott Downs is a 38 y.o. male presenting to UC with c/o laceration to his Left middle finger that occurred around 10PM last night. Pt accidentally cut it on a food processing blade. Bleeding controlled last night but states it keeps bleeding some when he bends his finger. He is Right hand dominant. Last tetanus- unknown   Past Medical History:  Diagnosis Date  . Allergy   . Asthma     Patient Active Problem List   Diagnosis Date Noted  . Asthma 06/07/2011  . GERD (gastroesophageal reflux disease) 06/07/2011  . EPIDIDYMITIS 08/21/2009  . TOBACCO USE 02/06/2009  . ALLERGIC RHINITIS 05/31/2007    Past Surgical History:  Procedure Laterality Date  . NASAL SEPTUM SURGERY    . WRIST FRACTURE SURGERY         Home Medications    Prior to Admission medications   Medication Sig Start Date End Date Taking? Authorizing Provider  lamoTRIgine (LAMICTAL) 200 MG tablet TAKE 1/2 TABLET BY MOUTH TWICE DAILY 04/12/18  Yes Melony Overly T, PA-C  Multiple Vitamin (MULTIVITAMIN) tablet Take 1 tablet by mouth daily.   Yes [provider]  NASONEX 50 MCG/ACT nasal spray USE 2 SPRAYS IN EACH NOSTRIL ONCE DAILY 06/12/11  Yes Gordy Savers, MD  QUEtiapine (SEROQUEL XR) 300 MG 24 hr tablet TAKE 2 TABLETS BY MOUTH AT BEDTIME 04/12/18  Yes Hurst, [place]ade Nurse, PA-C  SYMBICORT 160-4.5 MCG/ACT inhaler INHALE 2 PUFFS BY MOUTH TWICE A DAY 08/11/12  Yes Gordy Savers, MD  VENTOLIN HFA 108 (90 BASE) MCG/ACT inhaler USE 2 PUFFS EVERY 6 HOURS AS NEEDED FOR WHEEZING Patient not taking: Reported on 11/04/2014 08/11/10   Gordy Savers, MD    Family History Family History  Problem Relation Age of Onset  . Cancer Mother        ovarian ca  . Heart disease Father       Social History Social History   Tobacco Use  . Smoking status: Former Smoker    Last attempt to quit: 03/29/2011    Years since quitting: 7.1  . Smokeless tobacco: Never Used  Substance Use Topics  . Alcohol use: Yes    Alcohol/week: 0.0 standard drinks    Comment: rarely  . Drug use: No     Allergies   Patient has no known allergies.   Review of Systems Review of Systems  Musculoskeletal: Negative for arthralgias and myalgias.  Skin: Positive for wound. Negative for color change.  Neurological: Negative for weakness and numbness.     Physical Exam Triage Vital Signs ED Triage Vitals  Enc Vitals Group     BP      Pulse      Resp      Temp      Temp src      SpO2      Weight      Height      Head Circumference      Peak Flow      Pain Score      Pain Loc      Pain Edu?      Excl. in GC?    No data found.  Updated Vital Signs BP  138/85 (BP Location: Right Arm)   Pulse (!) 110   Temp 98.3 F (36.8 C) (Oral)   Resp 16   Ht 6\' 1"  (1.854 m)   Wt 255 lb (115.7 kg)   SpO2 98%   BMI 33.64 kg/m   Visual Acuity Right Eye Distance:   Left Eye Distance:   Bilateral Distance:    Right Eye Near:   Left Eye Near:    Bilateral Near:     Physical Exam Vitals signs and nursing note reviewed.  Constitutional:      Appearance: He is well-developed.  HENT:     Head: Normocephalic and atraumatic.  Neck:     Musculoskeletal: Normal range of motion.  Cardiovascular:     Rate and Rhythm: Normal rate.  Pulmonary:     Effort: Pulmonary effort is normal.  Musculoskeletal: Normal range of motion.        General: No tenderness.     Comments: Left middle finger: full ROM  Skin:    General: Skin is warm and dry.     Capillary Refill: Capillary refill takes less than 2 seconds.     Comments: Left middle finger, distal phalanx: 1.5in superficial linear laceration. Scant adipose tissue exposed. Minimal bleeding with movement of finger.   Neurological:      Mental Status: He is alert and oriented to person, place, and time.  Psychiatric:        Behavior: Behavior normal.      UC Treatments / Results  Labs (all labs ordered are listed, but only abnormal results are displayed) Labs Reviewed - No data to display  EKG None  Radiology No results found.  Procedures Laceration Repair Date/Time: 05/14/2018 10:31 AM Performed by: Lurene ShadowPhelps, Aquanetta Schwarz O, PA-C Authorized by: Lurene ShadowPhelps, Tiffini Blacksher O, PA-C   Consent:    Consent obtained:  Verbal   Consent given by:  Patient   Risks discussed:  Infection, pain, retained foreign body and poor wound healing   Alternatives discussed:  No treatment (sutures vs steri-stips given age of laceration) Anesthesia (see MAR for exact dosages):    Anesthesia method:  None Laceration details:    Location:  Finger   Finger location:  L long finger   Length (cm):  4   Depth (mm):  2 Repair type:    Repair type:  Simple Pre-procedure details:    Preparation:  Patient was prepped and draped in usual sterile fashion Exploration:    Hemostasis achieved with:  Direct pressure   Wound exploration: wound explored through full range of motion and entire depth of wound probed and visualized     Wound extent: no areolar tissue violation noted, no fascia violation noted, no foreign bodies/material noted, no muscle damage noted, no nerve damage noted, no tendon damage noted, no underlying fracture noted and no vascular damage noted     Contaminated: no   Treatment:    Area cleansed with:  Saline   Amount of cleaning:  Standard Skin repair:    Repair method:  Steri-Strips   Number of Steri-Strips:  3 Approximation:    Approximation:  Close Post-procedure details:    Dressing:  Bulky dressing   Patient tolerance of procedure:  Tolerated well, no immediate complications   (including critical care time)  Medications Ordered in UC Medications  Tdap (BOOSTRIX) injection 0.5 mL (0.5 mLs Intramuscular Given 05/14/18 0923)     Initial Impression / Assessment and Plan / UC Course  I have reviewed the triage vital signs and  the nursing notes.  Pertinent labs & imaging results that were available during my care of the patient were reviewed by me and considered in my medical decision making (see chart for details).     Given age and location of laceration, wound closed with steri-strips Tdap updated today Home care info provided  Final Clinical Impressions(s) / UC Diagnoses   Final diagnoses:  Laceration of left middle finger w/o foreign body w/o damage to nail, initial encounter   Discharge Instructions   None    ED Prescriptions    None     Controlled Substance Prescriptions [place] Controlled Substance Registry consulted? Not Applicable   Rolla Plate 05/14/18 1034

## 2018-05-14 NOTE — ED Triage Notes (Signed)
1 1/2" laceration to left middle finger, occurred last night while washing dishes, cut with food processor blade, bleeding controlled
